# Patient Record
Sex: Female | Born: 1965 | Race: White | Hispanic: No | Marital: Married | Smoking: Never smoker
Health system: Southern US, Community
[De-identification: ages and names within clinical notes are randomized; demographics above are authoritative.]

## PROBLEM LIST (undated history)

## (undated) DIAGNOSIS — Z8489 Family history of other specified conditions: Secondary | ICD-10-CM

## (undated) DIAGNOSIS — K219 Gastro-esophageal reflux disease without esophagitis: Secondary | ICD-10-CM

## (undated) HISTORY — PX: FOOT SURGERY: SHX648

---

## 1997-12-21 ENCOUNTER — Other Ambulatory Visit: Admission: RE | Admit: 1997-12-21 | Discharge: 1997-12-21 | Payer: Self-pay | Admitting: Gynecology

## 1999-10-24 ENCOUNTER — Encounter: Admission: RE | Admit: 1999-10-24 | Discharge: 1999-10-24 | Payer: Self-pay | Admitting: Gynecology

## 1999-10-24 ENCOUNTER — Encounter: Payer: Self-pay | Admitting: Gynecology

## 2000-12-17 ENCOUNTER — Other Ambulatory Visit: Admission: RE | Admit: 2000-12-17 | Discharge: 2000-12-17 | Payer: Self-pay | Admitting: Gynecology

## 2003-05-17 ENCOUNTER — Other Ambulatory Visit: Admission: RE | Admit: 2003-05-17 | Discharge: 2003-05-17 | Payer: Self-pay | Admitting: Gynecology

## 2004-06-14 ENCOUNTER — Other Ambulatory Visit: Admission: RE | Admit: 2004-06-14 | Discharge: 2004-06-14 | Payer: Self-pay | Admitting: Gynecology

## 2004-07-05 ENCOUNTER — Encounter: Admission: RE | Admit: 2004-07-05 | Discharge: 2004-07-05 | Payer: Self-pay | Admitting: Gynecology

## 2005-07-29 ENCOUNTER — Encounter: Admission: RE | Admit: 2005-07-29 | Discharge: 2005-07-29 | Payer: Self-pay | Admitting: Gynecology

## 2005-08-08 ENCOUNTER — Other Ambulatory Visit: Admission: RE | Admit: 2005-08-08 | Discharge: 2005-08-08 | Payer: Self-pay | Admitting: Gynecology

## 2006-09-15 ENCOUNTER — Encounter: Admission: RE | Admit: 2006-09-15 | Discharge: 2006-09-15 | Payer: Self-pay | Admitting: Gynecology

## 2006-10-15 ENCOUNTER — Other Ambulatory Visit: Admission: RE | Admit: 2006-10-15 | Discharge: 2006-10-15 | Payer: Self-pay | Admitting: Gynecology

## 2007-09-11 ENCOUNTER — Ambulatory Visit: Payer: Self-pay | Admitting: Internal Medicine

## 2007-12-23 ENCOUNTER — Encounter: Admission: RE | Admit: 2007-12-23 | Discharge: 2007-12-23 | Payer: Self-pay | Admitting: Gynecology

## 2008-01-11 ENCOUNTER — Other Ambulatory Visit: Admission: RE | Admit: 2008-01-11 | Discharge: 2008-01-11 | Payer: Self-pay | Admitting: Gynecology

## 2009-01-16 ENCOUNTER — Encounter: Admission: RE | Admit: 2009-01-16 | Discharge: 2009-01-16 | Payer: Self-pay | Admitting: Gynecology

## 2009-01-19 ENCOUNTER — Encounter: Admission: RE | Admit: 2009-01-19 | Discharge: 2009-01-19 | Payer: Self-pay | Admitting: Gynecology

## 2011-08-14 ENCOUNTER — Other Ambulatory Visit: Payer: Self-pay | Admitting: Gynecology

## 2011-08-14 DIAGNOSIS — R928 Other abnormal and inconclusive findings on diagnostic imaging of breast: Secondary | ICD-10-CM

## 2011-08-22 ENCOUNTER — Ambulatory Visit
Admission: RE | Admit: 2011-08-22 | Discharge: 2011-08-22 | Disposition: A | Discharge status: Home / Self Care | Payer: BC Managed Care – PPO | Source: Ambulatory Visit | Attending: Gynecology | Admitting: Gynecology

## 2011-08-22 DIAGNOSIS — R928 Other abnormal and inconclusive findings on diagnostic imaging of breast: Secondary | ICD-10-CM

## 2013-03-09 ENCOUNTER — Ambulatory Visit: Payer: Self-pay | Admitting: Family Medicine

## 2013-03-16 ENCOUNTER — Ambulatory Visit: Payer: Self-pay | Admitting: Internal Medicine

## 2013-08-11 ENCOUNTER — Encounter: Payer: Self-pay | Admitting: Podiatry

## 2013-08-11 ENCOUNTER — Ambulatory Visit (INDEPENDENT_AMBULATORY_CARE_PROVIDER_SITE_OTHER): Payer: BC Managed Care – PPO | Admitting: Podiatry

## 2013-08-11 ENCOUNTER — Ambulatory Visit (INDEPENDENT_AMBULATORY_CARE_PROVIDER_SITE_OTHER): Payer: BC Managed Care – PPO

## 2013-08-11 VITALS — BP 116/72 | HR 59 | Resp 16 | Ht 64.0 in | Wt 112.0 lb

## 2013-08-11 DIAGNOSIS — M79672 Pain in left foot: Secondary | ICD-10-CM

## 2013-08-11 DIAGNOSIS — M79671 Pain in right foot: Secondary | ICD-10-CM

## 2013-08-11 DIAGNOSIS — M79609 Pain in unspecified limb: Secondary | ICD-10-CM

## 2013-08-11 DIAGNOSIS — M722 Plantar fascial fibromatosis: Secondary | ICD-10-CM

## 2013-08-11 MED ORDER — MELOXICAM 15 MG PO TABS: 7.5000 mg | ORAL_TABLET | Freq: Every day | ORAL | Status: DC

## 2013-08-11 MED ORDER — METHYLPREDNISOLONE (PAK) 4 MG PO TABS: ORAL_TABLET | ORAL | Status: DC

## 2013-08-11 NOTE — Progress Notes (Signed)
   Subjective:    Patient ID: Karen Kelly, female    DOB: 09-17-1965, 48 y.o.   MRN: [id number]  HPI Comments: i have terrible pain in my right arch.  i dont feel it when i wear my heels , but in flat shoes or at night when i am barefoot it is sore. It started early December . Tried using arch supports and a band that goes around the arch and advil on occasion   Foot Pain      Review of Systems  All other systems reviewed and are negative.       Objective:   Physical Exam: I have reviewed her past mental history medications allergies surgeries social history. Pulses are +2/4 DPPT bilateral. Capillary fill time to digits one through 5 bilateral is immediate. Neurologic sensorium is intact per since once the monofilament bilateral. Deep tendon reflexes are brisk and equal bilateral. Muscle strength + over 5 dorsiflexors plantar flexors inverters everters all intrinsic musculature is intact. Orthopedic evaluation demonstrates all joints distal to the ankle a full range of motion without crepitus. She does have marked increase in the first intermetatarsal angle with painful hypertrophic medial condyle to the head of the first metatarsal also visible on radiograph is marked hallux abductovalgus deformity bilateral. She also has plantar distally oriented calcaneal heel spurs on radiograph consistent with plantar fasciitis as well as a soft tissue increase in density at the plantar fascial calcaneal insertion site. She also has pain on palpation to the point of maximal tenderness at the plantar fascial calcaneal insertion site of the right foot. Cutaneous evaluation demonstrates no erythema edema saline is drainage or odor.        Assessment & Plan:  Assessment: Plantar fasciitis right foot. 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  Plan: Discussed etiology pathology conservative versus surgical therapies. Injected the right heel today with Kenalog and local anesthetic dispensed  a plantar fascial brace and a night splint. Prescribed a Medrol Dosepak to be followed by [facility]. We also discussed appropriate shoe gear stretching exercises ice therapy shoe gear modifications. And I will followup with her in one month.  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

## 2013-09-08 ENCOUNTER — Ambulatory Visit: Payer: BC Managed Care – PPO | Admitting: Podiatry

## 2016-03-28 ENCOUNTER — Encounter: Payer: Self-pay | Admitting: Family Medicine

## 2016-03-28 ENCOUNTER — Ambulatory Visit (INDEPENDENT_AMBULATORY_CARE_PROVIDER_SITE_OTHER): Payer: BC Managed Care – PPO | Admitting: Family Medicine

## 2016-03-28 VITALS — BP 118/76 | HR 62 | Ht 64.0 in | Wt 113.0 lb

## 2016-03-28 DIAGNOSIS — S0501XA Injury of conjunctiva and corneal abrasion without foreign body, right eye, initial encounter: Secondary | ICD-10-CM

## 2016-03-28 DIAGNOSIS — H5711 Ocular pain, right eye: Secondary | ICD-10-CM

## 2016-03-28 MED ORDER — TOBRAMYCIN-DEXAMETHASONE 0.3-0.1 % OP OINT: 1.0000 "application " | TOPICAL_OINTMENT | Freq: Three times a day (TID) | OPHTHALMIC | 0 refills | Status: DC

## 2016-03-28 NOTE — Progress Notes (Signed)
Name: Karen Kelly   MRN: [id number]    DOB: [birth year]   Date:03/28/2016       Progress Note  Subjective  Chief Complaint  Chief Complaint  Patient presents with  . Eye Pain    Right eye pain woke her up in her sleep. Feels like something is in eye. Pain comes in "waves".    Eye Pain   The right eye is affected. This is a new problem. The current episode started today. The problem occurs daily. The problem has been waxing and waning. There was no injury mechanism (? FB). The pain is at a severity of 5/10. The pain is moderate. There is known exposure to pink eye. Associated symptoms include eye redness and a foreign body sensation. Pertinent negatives include no blurred vision, eye discharge, double vision, fever, itching, nausea, photophobia, recent URI, tingling or vomiting. Treatments tried: lid lifted. The treatment provided mild relief.    No problem-specific Assessment & Plan notes found for this encounter.   History reviewed. No pertinent past medical history.  History reviewed. No pertinent surgical history.  History reviewed. No pertinent family history.  Social History   Social History  . Marital status: Married    Spouse name: N/A  . Number of children: N/A  . Years of education: N/A   Occupational History  . Not on file.   Social History Main Topics  . Smoking status: Never Smoker  . Smokeless tobacco: Never Used  . Alcohol use No  . Drug use: No  . Sexual activity: Not on file   Other Topics Concern  . Not on file   Social History Narrative  . No narrative on file    No Known Allergies   Review of Systems  Constitutional: Negative for chills, fever, malaise/fatigue and weight loss.  HENT: Negative for ear discharge, ear pain and sore throat.   Eyes: Positive for pain and redness. Negative for blurred vision, double vision, photophobia, discharge and itching.  Respiratory: Negative for cough, sputum production, shortness of breath and wheezing.    Cardiovascular: Negative for chest pain, palpitations and leg swelling.  Gastrointestinal: Negative for abdominal pain, blood in stool, constipation, diarrhea, heartburn, melena, nausea and vomiting.  Genitourinary: Negative for dysuria, frequency, hematuria and urgency.  Musculoskeletal: Negative for back pain, joint pain, myalgias and neck pain.  Skin: Negative for rash.  Neurological: Negative for dizziness, tingling, sensory change, focal weakness and headaches.  Endo/Heme/Allergies: Negative for environmental allergies and polydipsia. Does not bruise/bleed easily.  Psychiatric/Behavioral: Negative for depression and suicidal ideas. The patient is not nervous/anxious and does not have insomnia.   All other systems reviewed and are negative.    Objective  Vitals:   03/28/16 1427  BP: 118/76  Pulse: 62  Weight: 113 lb (51.3 kg)  Height: 5\' 4"  (1.626 m)    Physical Exam  Constitutional: She is well-developed, well-nourished, and in no distress. No distress.  HENT:  Head: Normocephalic and atraumatic.  Right Ear: External ear normal.  Left Ear: External ear normal.  Nose: Nose normal.  Mouth/Throat: Oropharynx is clear and moist.  Eyes: Conjunctivae and EOM are normal. Pupils are equal, round, and reactive to light. Right eye exhibits no discharge. Left eye exhibits no discharge.  Neck: Normal range of motion. Neck supple. No JVD present. No thyromegaly present.  Cardiovascular: Normal rate, regular rhythm, normal heart sounds and intact distal pulses.  Exam reveals no gallop and no friction rub.   No murmur heard. Pulmonary/Chest: Effort  normal and breath sounds normal.  Abdominal: Soft. Bowel sounds are normal. She exhibits no mass. There is no tenderness. There is no guarding.  Musculoskeletal: Normal range of motion. She exhibits no edema.  Lymphadenopathy:    She has no cervical adenopathy.  Neurological: She is alert. She has normal reflexes.  Skin: Skin is warm and  dry. She is not diaphoretic.  Psychiatric: Mood and affect normal.  Nursing note and vitals reviewed.     Assessment & Plan  Problem List Items Addressed This Visit    None    Visit Diagnoses    Abrasion of right cornea, initial encounter    -  Primary   referral to ophthalmology        Dr. Otilio Miu [facility]  03/28/16

## 2018-06-26 DIAGNOSIS — Z9189 Other specified personal risk factors, not elsewhere classified: Secondary | ICD-10-CM | POA: Insufficient documentation

## 2018-08-26 ENCOUNTER — Other Ambulatory Visit: Payer: Self-pay | Admitting: Obstetrics and Gynecology

## 2018-08-26 DIAGNOSIS — Z9189 Other specified personal risk factors, not elsewhere classified: Secondary | ICD-10-CM

## 2018-09-17 ENCOUNTER — Other Ambulatory Visit: Payer: BC Managed Care – PPO

## 2018-10-05 ENCOUNTER — Other Ambulatory Visit: Payer: BC Managed Care – PPO

## 2018-11-04 ENCOUNTER — Other Ambulatory Visit: Payer: BC Managed Care – PPO

## 2019-03-08 ENCOUNTER — Ambulatory Visit: Payer: BC Managed Care – PPO | Admitting: Podiatry

## 2019-03-17 ENCOUNTER — Ambulatory Visit: Payer: BC Managed Care – PPO | Admitting: Podiatry

## 2019-03-22 ENCOUNTER — Encounter: Payer: Self-pay | Admitting: Podiatry

## 2019-03-22 ENCOUNTER — Ambulatory Visit (INDEPENDENT_AMBULATORY_CARE_PROVIDER_SITE_OTHER): Payer: BC Managed Care – PPO

## 2019-03-22 ENCOUNTER — Ambulatory Visit: Payer: BC Managed Care – PPO | Admitting: Podiatry

## 2019-03-22 ENCOUNTER — Other Ambulatory Visit: Payer: Self-pay

## 2019-03-22 DIAGNOSIS — M2012 Hallux valgus (acquired), left foot: Secondary | ICD-10-CM

## 2019-03-22 DIAGNOSIS — M2011 Hallux valgus (acquired), right foot: Secondary | ICD-10-CM | POA: Diagnosis not present

## 2019-03-22 NOTE — Progress Notes (Signed)
  Subjective:  Patient ID: Karen Kelly, female    DOB: [birth year],  MRN: [id number] HPI Chief Complaint  Patient presents with  . Bunions    Patient presents today for bilat bunions, right worse than left.x 3 years.  She reports her right has progressivly gotten worse over the past 4 months and her right toes 2,3 4 are going numb.  She states "it throbs at times and is worse when I run or depending on what shoes I wear."  She has tried diffrent shoes for reilef    52 y.o. female presents with the above complaint.   ROS: Denies fever chills nausea vomiting muscle aches pains calf pain back pain chest pain shortness of breath.  No past medical history on file. No past surgical history on file. No current outpatient medications on file.  No Known Allergies Review of Systems Objective:  There were no vitals filed for this visit.  General: Well developed, nourished, in no acute distress, alert and oriented x3   Dermatological: Skin is warm, dry and supple bilateral. Nails x 10 are well maintained; remaining integument appears unremarkable at this time. There are no open sores, no preulcerative lesions, no rash or signs of infection present.  Vascular: Dorsalis Pedis artery and Posterior Tibial artery pedal pulses are 2/4 bilateral with immedate capillary fill time. Pedal hair growth present. No varicosities and no lower extremity edema present bilateral.   Neruologic: Grossly intact via light touch bilateral. Vibratory intact via tuning fork bilateral. Protective threshold with Semmes Wienstein monofilament intact to all pedal sites bilateral. Patellar and Achilles deep tendon reflexes 2+ bilateral. No Babinski or clonus noted bilateral.  Palpable Mulder's click third interdigital space right.  Musculoskeletal: No gross boney pedal deformities bilateral. No pain, crepitus, or limitation noted with foot and ankle range of motion bilateral. Muscular strength 5/5 in all groups tested  bilateral.  Moderate to severe hallux abductovalgus deformity of the right foot over the left.  Elongated second metatarsal with mildly cocked up hammertoe deformity second right.  She has some flexibility of the first TMT joint.  Gait: Unassisted, Nonantalgic.    Radiographs:  Moderate to severely increased in the first intermetatarsal angle greater than normal value.  Right foot over left foot.  Mild hallux abductus angle greater than normal value right greater than left.  Early osteoarthritic changes right foot.  Plantarflexed elongated second metatarsal.  Assessment & Plan:   Assessment: Hallux valgus deformity plantarflexed metatarsal with capsulitis second neuroma third interdigital space right.  Plan: Discussed etiology pathology conservative surgical therapies at this point we discussed in great detail today surgery.  At this point we also went ahead and consented her for a Lapidus bunionectomy with fusion of the first TMT second metatarsal osteotomy shortening in nature and a injection to the third interdigital space with cortisone.  We also consented her for a below-knee cast.  We discussed the pros and cons of surgery and possible side effects which may include but not limited to postop pain bleeding swelling infection recurrence need further surgery overcorrection under correction loss of digit loss of limb loss of life.  She understood this was amenable to it we provided her with information regarding the surgery center anesthesia group and instructions for the morning of surgery.  I will follow-up with her at that time.     Karen Kelly T. [place], [place]

## 2019-03-22 NOTE — Patient Instructions (Signed)
Pre-Operative Instructions  Congratulations, you have decided to take an important step towards improving your quality of life.  You can be assured that the doctors and staff at [facility] will be with you every step of the way.  Here are some important things you should know:  1. Plan to be at the surgery center/hospital at least 1 (one) hour prior to your scheduled time, unless otherwise directed by the surgical center/hospital staff.  You must have a responsible adult accompany you, remain during the surgery and drive you home.  Make sure you have directions to the surgical center/hospital to ensure you arrive on time. 2. If you are having surgery at [facility] or [facility] hospitals, you will need a copy of your medical history and physical form from your family physician within one month prior to the date of surgery. We will give you a form for your primary physician to complete.  3. We make every effort to accommodate the date you request for surgery.  However, there are times where surgery dates or times have to be moved.  We will contact you as soon as possible if a change in schedule is required.   4. No aspirin/ibuprofen for one week before surgery.  If you are on aspirin, any non-steroidal anti-inflammatory medications (Mobic, Aleve, Ibuprofen) should not be taken seven (7) days prior to your surgery.  You make take Tylenol for pain prior to surgery.  5. Medications - If you are taking daily heart and blood pressure medications, seizure, reflux, allergy, asthma, anxiety, pain or diabetes medications, make sure you notify the surgery center/hospital before the day of surgery so they can tell you which medications you should take or avoid the day of surgery. 6. No food or drink after midnight the night before surgery unless directed otherwise by surgical center/hospital staff. 7. No alcoholic beverages 24-hours prior to surgery.  No smoking 24-hours prior or 24-hours after  surgery. 8. Wear loose pants or shorts. They should be loose enough to fit over bandages, boots, and casts. 9. Don't wear slip-on shoes. Sneakers are preferred. 10. Bring your boot with you to the surgery center/hospital.  Also bring crutches or a walker if your physician has prescribed it for you.  If you do not have this equipment, it will be provided for you after surgery. 11. If you have not been contacted by the surgery center/hospital by the day before your surgery, call to confirm the date and time of your surgery. 12. Leave-time from work may vary depending on the type of surgery you have.  Appropriate arrangements should be made prior to surgery with your employer. 13. Prescriptions will be provided immediately following surgery by your doctor.  Fill these as soon as possible after surgery and take the medication as directed. Pain medications will not be refilled on weekends and must be approved by the doctor. 14. Remove nail polish on the operative foot and avoid getting pedicures prior to surgery. 15. Wash the night before surgery.  The night before surgery wash the foot and leg well with water and the antibacterial soap provided. Be sure to pay special attention to beneath the toenails and in between the toes.  Wash for at least three (3) minutes. Rinse thoroughly with water and dry well with a towel.  Perform this wash unless told not to do so by your physician.  Enclosed: 1 Ice pack (please put in freezer the night before surgery)   1 Hibiclens skin cleaner     Pre-op instructions  If you have any questions regarding the instructions, please do not hesitate to call our office.  [place]: [address], [place], [place] [address] -- [phone]  [place]: [address] [place], [place] [address] -- [phone]  [place]: [address]  [place], [place] [address] -- [phone]   Website: [email or web address] 

## 2019-03-24 ENCOUNTER — Telehealth: Payer: Self-pay | Admitting: *Deleted

## 2019-03-24 NOTE — Telephone Encounter (Signed)
"  I am calling to schedule a surgical procedure with Dr. Milinda Pointer.  I'm from the [place] office.  If you could, give me a call back.  I was hoping to schedule for the first Friday in December, if at all possible.  You can certainly let me know what the availability is and we can go from there."

## 2019-03-24 NOTE — Telephone Encounter (Signed)
Left voicemail to get her surgery scheduled. Told her I could schedule her on Friday 05/21/2019 for her surgery as he was booked the first Friday in December. Asked her to call me back to let me know if 05/21/2019 worked for her.

## 2019-03-26 NOTE — Telephone Encounter (Signed)
I was calling back regarding scheduling surgery with Dr. Milinda Pointer for 05/21/2019. If I need additional information at this juncture you can reach me at [phone]. If not, I will have this on my schedule as well. Thank you so much.

## 2019-04-05 ENCOUNTER — Telehealth: Payer: Self-pay | Admitting: Podiatry

## 2019-04-05 NOTE — Telephone Encounter (Signed)
Left message for pt letting her know that I was changing her surgery date from 05/21/19 to 04/30/19 as Dr. Milinda Pointer had availability. I told her that as far as time goes I could not give her a time that it depended on if the surgical center had any changes to their surgery schedules. I explained they always call a day or two prior to let you know what time to arrive that it depends if they have any diabetic or pediatric patients that need to go back first. I stated to the pt to let me know if her MyChart password had expired and that if it had, I could send her a new password. Stated she would be able to communicate with [place] that way, not just me but the doctor or nurse with any questions, see when her appointments are scheduled etc. Told pt to call me back with any other questions.   I contacted Caren Griffins to change the surgery date on her end. I also rescheduled the surgery on Dr. Stephenie Acres schedule in [place] as well as in the surgical book.

## 2019-04-05 NOTE — Telephone Encounter (Signed)
I'm scheduled for surgery on 05/21/19 and wanted to make sure you got my message and have me scheduled for that day. With the changes to the school schedule, I was wondering if anything was available for 04/30/19 and if so, I would like to reschedule to then. If not, I'll keep my surgery scheduled for 05/21/19. I was also hoping to get a little more information about the time and just more details. Please give me a call back. Thank you.

## 2019-04-05 NOTE — Telephone Encounter (Signed)
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

## 2019-04-06 ENCOUNTER — Telehealth: Payer: Self-pay | Admitting: Podiatry

## 2019-04-06 NOTE — Telephone Encounter (Signed)
I don't have a MyChart account so I will need a code. Is MyChart for [facility] or [facility]?

## 2019-04-06 NOTE — Telephone Encounter (Signed)
Called pt and sent her a text via her cell phone with the code to get set up with [facility] [place]. Told pt she would have to register and she could set up her own user name and reset her password.

## 2019-04-28 ENCOUNTER — Encounter: Payer: Self-pay | Admitting: Podiatry

## 2019-04-30 ENCOUNTER — Encounter: Payer: Self-pay | Admitting: Podiatry

## 2019-04-30 ENCOUNTER — Other Ambulatory Visit: Payer: Self-pay | Admitting: Podiatry

## 2019-04-30 DIAGNOSIS — G5751 Tarsal tunnel syndrome, right lower limb: Secondary | ICD-10-CM | POA: Diagnosis not present

## 2019-04-30 DIAGNOSIS — M21541 Acquired clubfoot, right foot: Secondary | ICD-10-CM

## 2019-04-30 DIAGNOSIS — M2011 Hallux valgus (acquired), right foot: Secondary | ICD-10-CM

## 2019-04-30 MED ORDER — OXYCODONE-ACETAMINOPHEN 10-325 MG PO TABS: 1.0000 | ORAL_TABLET | ORAL | 0 refills | Status: DC | PRN

## 2019-04-30 NOTE — Progress Notes (Signed)
Rx for Percocet sent to pharmacy.

## 2019-05-03 ENCOUNTER — Encounter: Payer: BC Managed Care – PPO | Admitting: Podiatry

## 2019-05-10 ENCOUNTER — Ambulatory Visit (INDEPENDENT_AMBULATORY_CARE_PROVIDER_SITE_OTHER): Payer: BC Managed Care – PPO

## 2019-05-10 ENCOUNTER — Ambulatory Visit (INDEPENDENT_AMBULATORY_CARE_PROVIDER_SITE_OTHER): Payer: BC Managed Care – PPO | Admitting: Podiatry

## 2019-05-10 ENCOUNTER — Other Ambulatory Visit: Payer: Self-pay

## 2019-05-10 ENCOUNTER — Encounter: Payer: Self-pay | Admitting: Podiatry

## 2019-05-10 VITALS — BP 124/76 | HR 57 | Temp 99.0°F

## 2019-05-10 DIAGNOSIS — Z9889 Other specified postprocedural states: Secondary | ICD-10-CM

## 2019-05-10 DIAGNOSIS — M2011 Hallux valgus (acquired), right foot: Secondary | ICD-10-CM | POA: Diagnosis not present

## 2019-05-11 ENCOUNTER — Encounter: Payer: Self-pay | Admitting: Podiatry

## 2019-05-11 NOTE — Progress Notes (Signed)
  Subjective:  Patient ID: Karen Kelly, female    DOB: 05/14/1966,  MRN: [id number]  Chief Complaint  Patient presents with  . Routine Post Op     pov#1 dos 11.20.2020 Lapidus Procedure Including Bunionectomy Rt, Metatarsal Osteotomy 2nd Rt, Injection Neuroma 3rd Interspace Rt, Cast Application Rt -      53 y.o. female returns for post-op check.  Patient states that she is doing well.  She has mild pain to the right foot.  She denies any other acute problems.  She states she has been nonweightbearing to the right lower extremity with a cam boot.  She states the cast has been fine without any aggravation.  She denies any other acute complaints.  Review of Systems: Negative except as noted in the HPI. Denies N/V/F/Ch.  No past medical history on file.  Current Outpatient Medications:  .  oxyCODONE-acetaminophen (PERCOCET) 10-325 MG tablet, Take 1 tablet by mouth every 4 (four) hours as needed for pain., Disp: 20 tablet, Rfl: 0  Social History   Tobacco Use  Smoking Status Never Smoker  Smokeless Tobacco Never Used    No Known Allergies Objective:   Vitals:   05/10/19 1546  BP: 124/76  Pulse: (!) 57  Temp: 99 F (37.2 C)   There is no height or weight on file to calculate BMI. Constitutional Well developed. Well nourished.  Vascular Foot warm and well perfused. Capillary refill normal to all digits.   Neurologic Normal speech. Oriented to person, place, and time. Epicritic sensation to light touch grossly present bilaterally.  Dermatologic Skin healing well without signs of infection. Skin edges well coapted without signs of infection.  Orthopedic: Tenderness to palpation noted about the surgical site.   Radiographs: 3 views of skeletally mature adult foot:Hardware is intact without any signs of loosening noted.  Good correction of the bunion deformity and the second metatarsal Weil osteotomy noted. Assessment:   1. Hav (hallux abducto valgus), right   2. S/P foot  surgery, right    Plan:  Patient was evaluated and treated and all questions answered.  S/p foot surgery right -Progressing as expected post-operatively. -XR: See above -WB Status: Nonweightbearing in cam boot with crutches -Sutures: Unable to evaluate as the cast will be kept intact for this visit.  We plan on removing the cast during next visit to evaluate the incision. -Medications: None -Foot redressed.  No follow-ups on file.

## 2019-05-12 ENCOUNTER — Encounter: Payer: BC Managed Care – PPO | Admitting: Podiatry

## 2019-05-19 ENCOUNTER — Other Ambulatory Visit: Payer: Self-pay

## 2019-05-19 ENCOUNTER — Ambulatory Visit (INDEPENDENT_AMBULATORY_CARE_PROVIDER_SITE_OTHER): Payer: BC Managed Care – PPO | Admitting: Podiatry

## 2019-05-19 DIAGNOSIS — Z9889 Other specified postprocedural states: Secondary | ICD-10-CM

## 2019-05-19 DIAGNOSIS — M2011 Hallux valgus (acquired), right foot: Secondary | ICD-10-CM | POA: Diagnosis not present

## 2019-05-19 NOTE — Progress Notes (Signed)
She presents today for her second postop visit.  She denies fever chills nausea vomiting muscle aches pains calf pain back pain chest pain shortness of breath.  She states that the cast has been doing well and she has not been walking on it.  She states that she has been scared to move her toes.  Objective: Vital signs are stable she is alert and oriented x3 cast is in tract tacked and dry and clean.  Was removed demonstrates dressing is intact dry and clean.  Was removed demonstrates sutures are intact margins are well coapted no erythema no edema cellulitis drainage or odor good range of motion on passive range of motion first metatarsophalangeal joint and second metatarsal phalangeal joint.  Assessment: Well-healing surgical foot.  Plan: Redressed today applied another below-knee cast right lower extremity follow-up with her in 2 weeks for cast removal and boot application.

## 2019-05-26 ENCOUNTER — Encounter: Payer: BC Managed Care – PPO | Admitting: Podiatry

## 2019-06-02 ENCOUNTER — Ambulatory Visit (INDEPENDENT_AMBULATORY_CARE_PROVIDER_SITE_OTHER): Payer: BC Managed Care – PPO | Admitting: Podiatry

## 2019-06-02 ENCOUNTER — Ambulatory Visit (INDEPENDENT_AMBULATORY_CARE_PROVIDER_SITE_OTHER): Payer: BC Managed Care – PPO

## 2019-06-02 ENCOUNTER — Other Ambulatory Visit: Payer: Self-pay

## 2019-06-02 DIAGNOSIS — Z9889 Other specified postprocedural states: Secondary | ICD-10-CM

## 2019-06-02 DIAGNOSIS — M2011 Hallux valgus (acquired), right foot: Secondary | ICD-10-CM | POA: Diagnosis not present

## 2019-06-02 NOTE — Progress Notes (Signed)
She presents today for cast removal date of surgery May 08, 2019 status post Lapidus procedure a second metatarsal osteotomy right foot.  She denies fever chills nausea vomiting muscle aches and pains continue to use her crutches on a regular basis.  Objective: Vital signs stable she is alert and oriented x3.  Cast is intact was removed demonstrates dressed her dressing intact was removed demonstrates sutures are intact margins well coapted sutures were removed today margins remain well coapted she has good range of motion of the first metatarsophalangeal joint skin is dry and stiff but pulses are palpable.  There is no edema no erythema cellulitis drainage or odor.  Radiographs demonstrate internal fixation is in good position with osseous cross bridging and induction taken place.  Assessment: Well-healing surgical foot.  Plan: Put her in a compression anklet for any potential swelling and also put her in a cam walker.  I will let her stand static but if she is going to move about she will continue to use nonweightbearing status with her crutches and/or knee scooter.  Follow-up with her in 2 weeks x-rays will be taken at that time

## 2019-06-09 ENCOUNTER — Encounter: Payer: BC Managed Care – PPO | Admitting: Podiatry

## 2019-06-16 ENCOUNTER — Other Ambulatory Visit: Payer: Self-pay

## 2019-06-16 ENCOUNTER — Ambulatory Visit (INDEPENDENT_AMBULATORY_CARE_PROVIDER_SITE_OTHER): Payer: BC Managed Care – PPO | Admitting: Podiatry

## 2019-06-16 ENCOUNTER — Ambulatory Visit (INDEPENDENT_AMBULATORY_CARE_PROVIDER_SITE_OTHER): Payer: BC Managed Care – PPO

## 2019-06-16 ENCOUNTER — Encounter: Payer: Self-pay | Admitting: Podiatry

## 2019-06-16 DIAGNOSIS — M2011 Hallux valgus (acquired), right foot: Secondary | ICD-10-CM | POA: Diagnosis not present

## 2019-06-16 DIAGNOSIS — Z9889 Other specified postprocedural states: Secondary | ICD-10-CM

## 2019-06-16 NOTE — Progress Notes (Signed)
She presents today date of surgery 04/30/2019 Lapidus procedure including bunionectomy second metatarsal osteotomy right.  She states that she is back to work she has some swelling but all in all she is doing very well.  Objective: Vital signs are stable she is alert and oriented x3.  Pulses are palpable.  She has some swelling about the forefoot dorsal and plantar.  Radiographs taken today demonstrate well-healing surgical foot well-placed internal fixation still in good position.  Assessment: Well-healing surgical foot l right.  Plan: I will allow her to start partial weightbearing with the boot in a motor dispensed a Darco shoe she will convert to the shoe in the next few days and then by next time she will be in a tennis shoe at office visit.

## 2019-07-07 ENCOUNTER — Other Ambulatory Visit: Payer: Self-pay

## 2019-07-07 ENCOUNTER — Ambulatory Visit (INDEPENDENT_AMBULATORY_CARE_PROVIDER_SITE_OTHER): Payer: BC Managed Care – PPO

## 2019-07-07 ENCOUNTER — Ambulatory Visit (INDEPENDENT_AMBULATORY_CARE_PROVIDER_SITE_OTHER): Payer: BC Managed Care – PPO | Admitting: Podiatry

## 2019-07-07 DIAGNOSIS — Z9889 Other specified postprocedural states: Secondary | ICD-10-CM

## 2019-07-07 DIAGNOSIS — M2011 Hallux valgus (acquired), right foot: Secondary | ICD-10-CM

## 2019-07-07 NOTE — Progress Notes (Signed)
She presents today for follow-up of her Lapidus procedure date of surgery April 30, 2019 with metatarsal osteotomy second right.  States that the pain is better but it still swells a lot and gets some tingling.  Objective: Vital signs are stable alert and oriented x3.  She has limited range of motion the first metatarsophalangeal joint of the right foot which was full at the time of surgery.  Radiographs taken today demonstrate well healing fusion site.  No signs of infection and no loosening of internal fixation.  Assessment: Well-healing surgical foot.  Plan: Encourage range of motion exercises and encouraged ambulation.  We will send her to physical therapy to increase mobility.

## 2019-08-04 ENCOUNTER — Encounter: Payer: Self-pay | Admitting: Podiatry

## 2019-08-04 ENCOUNTER — Ambulatory Visit (INDEPENDENT_AMBULATORY_CARE_PROVIDER_SITE_OTHER): Payer: BC Managed Care – PPO | Admitting: Podiatry

## 2019-08-04 ENCOUNTER — Ambulatory Visit (INDEPENDENT_AMBULATORY_CARE_PROVIDER_SITE_OTHER): Payer: BC Managed Care – PPO

## 2019-08-04 ENCOUNTER — Other Ambulatory Visit: Payer: Self-pay

## 2019-08-04 DIAGNOSIS — M2011 Hallux valgus (acquired), right foot: Secondary | ICD-10-CM

## 2019-08-04 DIAGNOSIS — Z9889 Other specified postprocedural states: Secondary | ICD-10-CM

## 2019-08-04 NOTE — Progress Notes (Signed)
She presents today date of surgery April 30, 2019 status post Lapidus procedure and a second metatarsal osteotomy right foot.  She states that she is doing a lot better now with physical therapy and she is able to walk on the foot in a shoe in the more of a heel-to-toe fashion.  Still has tenderness beneath the second metatarsophalangeal joint.  Objective: Vital signs stable she is alert and oriented x3.  Mild elevation of the first metatarsal is noted.  However I think part of that is favoring her forefoot.  This is demonstrated primarily on lateral radiograph.  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  I do think that stiffness will probably go ahead and resolve once she has full range of motion back.  Assessment: Well-healing surgical foot.  Plan: I encouraged her to be more aggressive with her range of motion and activities I will follow-up with her in about a month she should be finishing up physical therapy by then we will reevaluate the second metatarsal and do another set of x-rays with her full body weight on that foot.

## 2019-09-01 ENCOUNTER — Ambulatory Visit: Payer: BC Managed Care – PPO | Admitting: Podiatry

## 2019-09-01 ENCOUNTER — Other Ambulatory Visit: Payer: Self-pay

## 2019-09-01 ENCOUNTER — Ambulatory Visit (INDEPENDENT_AMBULATORY_CARE_PROVIDER_SITE_OTHER): Payer: BC Managed Care – PPO

## 2019-09-01 ENCOUNTER — Encounter: Payer: Self-pay | Admitting: Podiatry

## 2019-09-01 DIAGNOSIS — Z9889 Other specified postprocedural states: Secondary | ICD-10-CM

## 2019-09-01 DIAGNOSIS — M2011 Hallux valgus (acquired), right foot: Secondary | ICD-10-CM

## 2019-09-01 NOTE — Progress Notes (Signed)
She presents today date of surgery April 30, 2019 Lapidus procedure including bunionectomy with second metatarsal osteotomy and excision neuroma third interdigital space states that is feeling pretty good is doing really well she is very happy with the outcome thus far she states that she still has a little bit of swelling that physical therapy seems to be helping out with considerably.  Objective: Vital signs are stable she is alert and oriented x3.  Pulses are palpable.  She has some edema in the distalmost aspect of the foot at the level of the second metatarsophalangeal joint otherwise it is landmarks are coming back normally vessels and tendons are starting to reappear she has good range of motion of the second toe though we do need to plantarflex the second proximal phalanx at the level of the joint a little more but I think that that will calm with physical therapy and reduction in the swelling.  Radiographs taken today demonstrate well healing arthrodesis at the first TMT J.  Assessment: Well-healing surgical foot.  Plan: Continue physical therapy follow-up with me when she has completed physical therapy.

## 2019-12-06 ENCOUNTER — Other Ambulatory Visit: Payer: Self-pay

## 2019-12-06 ENCOUNTER — Ambulatory Visit: Payer: BC Managed Care – PPO | Admitting: Family Medicine

## 2019-12-06 ENCOUNTER — Encounter: Payer: Self-pay | Admitting: Family Medicine

## 2019-12-06 VITALS — BP 120/72 | HR 76 | Ht 64.0 in | Wt 105.0 lb

## 2019-12-06 DIAGNOSIS — L247 Irritant contact dermatitis due to plants, except food: Secondary | ICD-10-CM

## 2019-12-06 DIAGNOSIS — Z1211 Encounter for screening for malignant neoplasm of colon: Secondary | ICD-10-CM

## 2019-12-06 DIAGNOSIS — W57XXXA Bitten or stung by nonvenomous insect and other nonvenomous arthropods, initial encounter: Secondary | ICD-10-CM | POA: Diagnosis not present

## 2019-12-06 MED ORDER — TRIAMCINOLONE ACETONIDE 0.1 % EX CREA: 1.0000 "application " | TOPICAL_CREAM | Freq: Two times a day (BID) | CUTANEOUS | 1 refills | Status: DC

## 2019-12-06 MED ORDER — PREDNISONE 10 MG PO TABS: 10.0000 mg | ORAL_TABLET | Freq: Every day | ORAL | 0 refills | Status: DC

## 2019-12-06 NOTE — Progress Notes (Signed)
Date:  12/06/2019   Name:  Karen Kelly   DOB:  1966-03-16   MRN:  [id number]   Chief Complaint: Rash (rash on arms and has spread over body. Itching- was using chainsaw trimming trees.) and Colon Cancer Screening (colonoscopy)  Rash This is a new problem. The current episode started in the past 7 days (Saturday). The problem has been gradually worsening since onset. The rash is diffuse. The rash is characterized by redness and itchiness. She was exposed to an insect bite/sting. Pertinent negatives include no anorexia, congestion, cough, diarrhea, eye pain, facial edema, fatigue, fever, joint pain, nail changes, rhinorrhea, shortness of breath, sore throat or vomiting. Treatments tried: calamine/cortizone. The treatment provided no relief.    No results found for: CREATININE, BUN, NA, K, CL, CO2 No results found for: CHOL, HDL, LDLCALC, LDLDIRECT, TRIG, CHOLHDL No results found for: TSH No results found for: HGBA1C No results found for: WBC, HGB, HCT, MCV, PLT No results found for: ALT, AST, GGT, ALKPHOS, BILITOT   Review of Systems  Constitutional: Negative.  Negative for chills, fatigue, fever and unexpected weight change.  HENT: Negative for congestion, ear discharge, ear pain, rhinorrhea, sinus pressure, sneezing and sore throat.   Eyes: Negative for photophobia, pain, discharge, redness and itching.  Respiratory: Negative for cough, shortness of breath, wheezing and stridor.   Cardiovascular: Negative for chest pain, palpitations and leg swelling.  Gastrointestinal: Negative for abdominal pain, anorexia, blood in stool, constipation, diarrhea, nausea and vomiting.  Endocrine: Negative for cold intolerance, heat intolerance, polydipsia, polyphagia and polyuria.  Genitourinary: Negative for dysuria, flank pain, frequency, hematuria, menstrual problem, pelvic pain, urgency, vaginal bleeding and vaginal discharge.  Musculoskeletal: Negative for arthralgias, back pain, joint pain and  myalgias.  Skin: Positive for rash. Negative for nail changes.  Allergic/Immunologic: Negative for environmental allergies and food allergies.  Neurological: Negative for dizziness, weakness, light-headedness, numbness and headaches.  Hematological: Negative for adenopathy. Does not bruise/bleed easily.  Psychiatric/Behavioral: Negative for dysphoric mood. The patient is not nervous/anxious.     Patient Active Problem List   Diagnosis Date Noted  . At high risk for breast cancer 06/26/2018    No Known Allergies  No past surgical history on file.  Social History   Tobacco Use  . Smoking status: Never Smoker  . Smokeless tobacco: Never Used  Substance Use Topics  . Alcohol use: No  . Drug use: No     Medication list has been reviewed and updated.  No outpatient medications have been marked as taking for the 12/06/19 encounter (Office Visit) with Juline Patch, MD.    [facility] 2/9 Scores 12/06/2019  PHQ - 2 Score 0  PHQ- 9 Score 0    GAD 7 : Generalized Anxiety Score 12/06/2019  Nervous, Anxious, on Edge 0  Control/stop worrying 0  Worry too much - different things 0  Trouble relaxing 0  Restless 0  Easily annoyed or irritable 0  Afraid - awful might happen 0  Total GAD 7 Score 0    BP Readings from Last 3 Encounters:  12/06/19 120/72  05/10/19 124/76  03/28/16 118/76    Physical Exam Vitals and nursing note reviewed.  Constitutional:      General: She is not in acute distress.    Appearance: She is not diaphoretic.  HENT:     Head: Normocephalic and atraumatic.     Right Ear: Tympanic membrane, ear canal and external ear normal.     Left Ear: Tympanic  membrane, ear canal and external ear normal.     Nose: Nose normal. No congestion or rhinorrhea.  Eyes:     General:        Right eye: No discharge.        Left eye: No discharge.     Conjunctiva/sclera: Conjunctivae normal.     Pupils: Pupils are equal, round, and reactive to light.  Neck:     Thyroid: No  thyromegaly.     Vascular: No carotid bruit or JVD.  Cardiovascular:     Rate and Rhythm: Normal rate and regular rhythm.     Heart sounds: Normal heart sounds. No murmur heard.  No friction rub. No gallop.   Pulmonary:     Effort: Pulmonary effort is normal.     Breath sounds: Normal breath sounds. No wheezing, rhonchi or rales.  Abdominal:     General: Bowel sounds are normal.     Palpations: Abdomen is soft. There is no mass.     Tenderness: There is no abdominal tenderness. There is no right CVA tenderness, left CVA tenderness or guarding.  Musculoskeletal:        General: Normal range of motion.     Cervical back: Normal range of motion and neck supple.  Lymphadenopathy:     Cervical: No cervical adenopathy.  Skin:    General: Skin is warm and dry.     Coloration: Skin is not jaundiced or pale.     Findings: No bruising, erythema, lesion or rash.  Neurological:     Mental Status: She is alert.     Deep Tendon Reflexes: Reflexes are normal and symmetric.     Wt Readings from Last 3 Encounters:  12/06/19 105 lb (47.6 kg)  03/28/16 113 lb (51.3 kg)  08/11/13 112 lb (50.8 kg)    BP 120/72   Pulse 76   Ht 5\' 4"  (1.626 m)   Wt 105 lb (47.6 kg)   BMI 18.02 kg/m   Assessment and Plan: 1. Contact dermatitis and eczema due to plant New onset.  Patient got a chain saw for retirement and was using it and afterwards developed a rash over her arms and legs and body.  There is no vesicular rash or Koebner phenomenon to suggest contact dermatitis from plant however it may be in early stages and we will initiate prednisone taper [id number].  We will also use some triamcinolone cream on the individual bites there is suspect to be insect developed. - predniSONE (DELTASONE) 10 MG tablet; Take 1 tablet (10 mg total) by mouth daily with breakfast. Taper 4,4,4,3,3,3,2,2,2,1,1,1  Dispense: 30 tablet; Refill: 0 - triamcinolone cream (KENALOG) 0.1 %; Apply 1 application topically 2 (two)  times daily.  Dispense: 453.6 g; Refill: 1 - Ambulatory referral to Gastroenterology  2. Insect bite, unspecified site, initial encounter As noted above - predniSONE (DELTASONE) 10 MG tablet; Take 1 tablet (10 mg total) by mouth daily with breakfast. Taper 4,4,4,3,3,3,2,2,2,1,1,1  Dispense: 30 tablet; Refill: 0 - triamcinolone cream (KENALOG) 0.1 %; Apply 1 application topically 2 (two) times daily.  Dispense: 453.6 g; Refill: 1  3. Colon cancer screening Discussed with patient and colonoscopy was scheduled with [facility]/Dr. Allen Norris.

## 2019-12-14 ENCOUNTER — Telehealth (INDEPENDENT_AMBULATORY_CARE_PROVIDER_SITE_OTHER): Payer: Self-pay | Admitting: Gastroenterology

## 2019-12-14 ENCOUNTER — Other Ambulatory Visit (INDEPENDENT_AMBULATORY_CARE_PROVIDER_SITE_OTHER): Payer: Self-pay

## 2019-12-14 DIAGNOSIS — Z1211 Encounter for screening for malignant neoplasm of colon: Secondary | ICD-10-CM

## 2019-12-14 NOTE — Progress Notes (Signed)
Gastroenterology Pre-Procedure Review  Request Date: Thursday 01/27/20 Requesting Physician: Dr. Allen Norris  PATIENT REVIEW QUESTIONS: The patient responded to the following health history questions as indicated:    1. Are you having any GI issues? no 2. Do you have a personal history of Polyps? no 3. Do you have a family history of Colon Cancer or Polyps? no 4. Diabetes Mellitus? no 5. Joint replacements in the past 12 months?no 6. Major health problems in the past 3 months?no 7. Any artificial heart valves, MVP, or defibrillator?no    MEDICATIONS & ALLERGIES:    Patient reports the following regarding taking any anticoagulation/antiplatelet therapy:   Plavix, Coumadin, Eliquis, Xarelto, Lovenox, Pradaxa, Brilinta, or Effient? no Aspirin? no  Patient confirms/reports the following medications:  Current Outpatient Medications  Medication Sig Dispense Refill  . triamcinolone cream (KENALOG) 0.1 % Apply 1 application topically 2 (two) times daily. 453.6 g 1  . predniSONE (DELTASONE) 10 MG tablet Take 1 tablet (10 mg total) by mouth daily with breakfast. Taper 4,4,4,3,3,3,2,2,2,1,1,1 (Patient not taking: Reported on 12/14/2019) 30 tablet 0   No current facility-administered medications for this visit.    Patient confirms/reports the following allergies:  No Known Allergies  No orders of the defined types were placed in this encounter.   AUTHORIZATION INFORMATION Primary Insurance: 1D#: Group #:  Secondary Insurance: 1D#: Group #:  SCHEDULE INFORMATION: Date: Thursday 01/27/20 Time: Location:[facility]

## 2019-12-29 ENCOUNTER — Ambulatory Visit: Payer: BC Managed Care – PPO | Admitting: Podiatry

## 2019-12-29 ENCOUNTER — Encounter: Payer: Self-pay | Admitting: Podiatry

## 2019-12-29 ENCOUNTER — Other Ambulatory Visit: Payer: Self-pay

## 2019-12-29 DIAGNOSIS — M2011 Hallux valgus (acquired), right foot: Secondary | ICD-10-CM | POA: Diagnosis not present

## 2019-12-29 DIAGNOSIS — M79672 Pain in left foot: Secondary | ICD-10-CM

## 2019-12-29 NOTE — Progress Notes (Signed)
She presents today states that she is doing pretty well as she refers to the second metatarsophalangeal joint.  States that it still swells.  She would like to consider orthotics.  Objective: Vital signs are stable alert oriented x3 she still has pain on the plantar aspect of the second metatarsophalangeal joint of the right foot.  At this point I think the capsulitis is her biggest problem radiographs do not demonstrate any type of osseous abnormalities in this area retention of the screws may be part of the pain that is present.  Assessment: Capsulitis second metatarsophalangeal joint of the right foot and possible painful screws.  Plan: Orthotics were made today.

## 2020-01-21 ENCOUNTER — Other Ambulatory Visit: Payer: Self-pay

## 2020-01-21 ENCOUNTER — Encounter: Payer: Self-pay | Admitting: Gastroenterology

## 2020-01-25 ENCOUNTER — Other Ambulatory Visit
Admission: RE | Admit: 2020-01-25 | Discharge: 2020-01-25 | Disposition: A | Discharge status: Home / Self Care | Payer: BC Managed Care – PPO | Source: Ambulatory Visit | Attending: Gastroenterology | Admitting: Gastroenterology

## 2020-01-25 ENCOUNTER — Other Ambulatory Visit: Payer: Self-pay

## 2020-01-25 DIAGNOSIS — Z20822 Contact with and (suspected) exposure to covid-19: Secondary | ICD-10-CM | POA: Insufficient documentation

## 2020-01-25 DIAGNOSIS — Z01812 Encounter for preprocedural laboratory examination: Secondary | ICD-10-CM | POA: Diagnosis not present

## 2020-01-25 LAB — SARS CORONAVIRUS 2 (TAT 6-24 HRS): SARS Coronavirus 2: NEGATIVE

## 2020-01-26 ENCOUNTER — Other Ambulatory Visit: Payer: Self-pay

## 2020-01-26 ENCOUNTER — Ambulatory Visit: Payer: BC Managed Care – PPO | Admitting: Orthotics

## 2020-01-26 DIAGNOSIS — M2012 Hallux valgus (acquired), left foot: Secondary | ICD-10-CM

## 2020-01-26 DIAGNOSIS — M2011 Hallux valgus (acquired), right foot: Secondary | ICD-10-CM

## 2020-01-26 NOTE — Discharge Instructions (Signed)
General Anesthesia, Adult, Care After This sheet gives you information about how to care for yourself after your procedure. Your health care provider may also give you more specific instructions. If you have problems or questions, contact your health care provider. What can I expect after the procedure? After the procedure, the following side effects are common:  Pain or discomfort at the IV site.  Nausea.  Vomiting.  Sore throat.  Trouble concentrating.  Feeling cold or chills.  Weak or tired.  Sleepiness and fatigue.  Soreness and body aches. These side effects can affect parts of the body that were not involved in surgery. Follow these instructions at home:  For at least 24 hours after the procedure:  Have a responsible adult stay with you. It is important to have someone help care for you until you are awake and alert.  Rest as needed.  Do not: ? Participate in activities in which you could fall or become injured. ? Drive. ? Use heavy machinery. ? Drink alcohol. ? Take sleeping pills or medicines that cause drowsiness. ? Make important decisions or sign legal documents. ? Take care of children on your own. Eating and drinking  Follow any instructions from your health care provider about eating or drinking restrictions.  When you feel hungry, start by eating small amounts of foods that are soft and easy to digest (bland), such as toast. Gradually return to your regular diet.  Drink enough fluid to keep your urine pale yellow.  If you vomit, rehydrate by drinking water, juice, or clear broth. General instructions  If you have sleep apnea, surgery and certain medicines can increase your risk for breathing problems. Follow instructions from your health care provider about wearing your sleep device: ? Anytime you are sleeping, including during daytime naps. ? While taking prescription pain medicines, sleeping medicines, or medicines that make you drowsy.  Return to  your normal activities as told by your health care provider. Ask your health care provider what activities are safe for you.  Take over-the-counter and prescription medicines only as told by your health care provider.  If you smoke, do not smoke without supervision.  Keep all follow-up visits as told by your health care provider. This is important. Contact a health care provider if:  You have nausea or vomiting that does not get better with medicine.  You cannot eat or drink without vomiting.  You have pain that does not get better with medicine.  You are unable to pass urine.  You develop a skin rash.  You have a fever.  You have redness around your IV site that gets worse. Get help right away if:  You have difficulty breathing.  You have chest pain.  You have blood in your urine or stool, or you vomit blood. Summary  After the procedure, it is common to have a sore throat or nausea. It is also common to feel tired.  Have a responsible adult stay with you for the first 24 hours after general anesthesia. It is important to have someone help care for you until you are awake and alert.  When you feel hungry, start by eating small amounts of foods that are soft and easy to digest (bland), such as toast. Gradually return to your regular diet.  Drink enough fluid to keep your urine pale yellow.  Return to your normal activities as told by your health care provider. Ask your health care provider what activities are safe for you. This information is not   intended to replace advice given to you by your health care provider. Make sure you discuss any questions you have with your health care provider. Document Revised: 05/30/2017 Document Reviewed: 01/10/2017 Elsevier Patient Education  2020 [employer]  

## 2020-01-26 NOTE — Progress Notes (Signed)
Patient came in today to pick up custom made foot orthotics.  The goals were accomplished and the patient reported no dissatisfaction with said orthotics.  Patient was advised of breakin period and how to report any issues. 

## 2020-01-27 ENCOUNTER — Other Ambulatory Visit: Payer: Self-pay

## 2020-01-27 ENCOUNTER — Ambulatory Visit: Payer: BC Managed Care – PPO | Admitting: Anesthesiology

## 2020-01-27 ENCOUNTER — Ambulatory Visit
Admission: RE | Admit: 2020-01-27 | Discharge: 2020-01-27 | Disposition: A | Discharge status: Home / Self Care | Payer: BC Managed Care – PPO | Attending: Gastroenterology | Admitting: Gastroenterology

## 2020-01-27 ENCOUNTER — Encounter: Payer: Self-pay | Admitting: Gastroenterology

## 2020-01-27 ENCOUNTER — Other Ambulatory Visit: Payer: Self-pay | Admitting: Gastroenterology

## 2020-01-27 ENCOUNTER — Encounter
Admission: RE | Disposition: A | Discharge status: Home / Self Care | Payer: Self-pay | Source: Home / Self Care | Attending: Gastroenterology

## 2020-01-27 DIAGNOSIS — Z1211 Encounter for screening for malignant neoplasm of colon: Secondary | ICD-10-CM

## 2020-01-27 DIAGNOSIS — K219 Gastro-esophageal reflux disease without esophagitis: Secondary | ICD-10-CM | POA: Diagnosis not present

## 2020-01-27 DIAGNOSIS — K640 First degree hemorrhoids: Secondary | ICD-10-CM | POA: Diagnosis not present

## 2020-01-27 DIAGNOSIS — D120 Benign neoplasm of cecum: Secondary | ICD-10-CM

## 2020-01-27 HISTORY — PX: COLONOSCOPY WITH PROPOFOL: SHX5780

## 2020-01-27 HISTORY — DX: Family history of other specified conditions: Z84.89

## 2020-01-27 HISTORY — DX: Gastro-esophageal reflux disease without esophagitis: K21.9

## 2020-01-27 HISTORY — PX: POLYPECTOMY: SHX5525

## 2020-01-27 SURGERY — COLONOSCOPY WITH PROPOFOL
Anesthesia: Monitor Anesthesia Care | Site: Rectum

## 2020-01-27 MED ORDER — LACTATED RINGERS IV SOLN: INTRAVENOUS | Status: DC

## 2020-01-27 MED ORDER — LIDOCAINE HCL (CARDIAC) PF 100 MG/5ML IV SOSY
PREFILLED_SYRINGE | INTRAVENOUS | Status: DC | PRN
  Administered 2020-01-27: 30 mg via INTRAVENOUS

## 2020-01-27 MED ORDER — SODIUM CHLORIDE 0.9 % IV SOLN: INTRAVENOUS | Status: DC

## 2020-01-27 MED ORDER — PROPOFOL 10 MG/ML IV BOLUS
INTRAVENOUS | Status: DC | PRN
  Administered 2020-01-27: 30 mg via INTRAVENOUS
  Administered 2020-01-27: 140 mg via INTRAVENOUS
  Administered 2020-01-27 (×2): 30 mg via INTRAVENOUS

## 2020-01-27 MED ORDER — STERILE WATER FOR IRRIGATION IR SOLN: Status: DC | PRN

## 2020-01-27 SURGICAL SUPPLY — 7 items
GOWN CVR UNV OPN BCK APRN NK (MISCELLANEOUS) ×2 IMPLANT
GOWN ISOL THUMB LOOP REG UNIV (MISCELLANEOUS) ×6
KIT ENDO PROCEDURE OLY (KITS) ×3 IMPLANT
MANIFOLD NEPTUNE II (INSTRUMENTS) ×3 IMPLANT
SNARE SHORT THROW 13M SML OVAL (MISCELLANEOUS) ×2 IMPLANT
TRAP ETRAP POLY (MISCELLANEOUS) ×2 IMPLANT
WATER STERILE IRR 250ML POUR (IV SOLUTION) ×3 IMPLANT

## 2020-01-27 NOTE — Anesthesia Preprocedure Evaluation (Addendum)
Anesthesia Evaluation  Patient identified by MRN, date of birth, ID band Patient awake    Reviewed: Allergy & Precautions, H&P , NPO status , Patient's Chart, lab work & pertinent test results  Airway Mallampati: II  TM Distance: >3 FB Neck ROM: full    Dental no notable dental hx.    Pulmonary    Pulmonary exam normal breath sounds clear to auscultation       Cardiovascular Normal cardiovascular exam Rhythm:regular Rate:Normal     Neuro/Psych    GI/Hepatic GERD  ,  Endo/Other    Renal/GU      Musculoskeletal   Abdominal   Peds  Hematology   Anesthesia Other Findings   Reproductive/Obstetrics                             Anesthesia Physical Anesthesia Plan  ASA: II  Anesthesia Plan: General   Post-op Pain Management:    Induction: Intravenous  PONV Risk Score and Plan: 3 and Treatment may vary due to age or medical condition, TIVA and Propofol infusion  Airway Management Planned: Natural Airway  Additional Equipment:   Intra-op Plan:   Post-operative Plan:   Informed Consent: I have reviewed the patients History and Physical, chart, labs and discussed the procedure including the risks, benefits and alternatives for the proposed anesthesia with the patient or authorized representative who has indicated his/her understanding and acceptance.     Dental Advisory Given  Plan Discussed with: CRNA  Anesthesia Plan Comments:         Anesthesia Quick Evaluation  

## 2020-01-27 NOTE — Anesthesia Procedure Notes (Signed)
Performed by: Meighan Treto, CRNA Pre-anesthesia Checklist: Patient identified, Emergency Drugs available, Suction available, Timeout performed and Patient being monitored Patient Re-evaluated:Patient Re-evaluated prior to induction Oxygen Delivery Method: Nasal cannula Placement Confirmation: positive ETCO2       

## 2020-01-27 NOTE — H&P (Signed)
Lucilla Lame, MD [address] [place], [place] [address] Phone: [phone] Fax : [phone]  Primary Care Physician:  Juline Patch, MD Primary Gastroenterologist:  Dr. Allen Norris  Pre-Procedure History & Physical: HPI:  Karen Kelly is a 54 y.o. female is here for a screening colonoscopy.   Past Medical History:  Diagnosis Date  . Family history of adverse reaction to anesthesia    hard time waking up (mom)  . GERD (gastroesophageal reflux disease)     Past Surgical History:  Procedure Laterality Date  . FOOT SURGERY      Prior to Admission medications   Medication Sig Start Date End Date Taking? Authorizing Provider  triamcinolone cream (KENALOG) 0.1 % Apply 1 application topically 2 (two) times daily. 12/06/19  Yes Juline Patch, MD    Allergies as of 12/14/2019  . (No Known Allergies)    History reviewed. No pertinent family history.  Social History   Socioeconomic History  . Marital status: Married    Spouse name: Not on file  . Number of children: Not on file  . Years of education: Not on file  . Highest education level: Not on file  Occupational History  . Not on file  Tobacco Use  . Smoking status: Never Smoker  . Smokeless tobacco: Never Used  Substance and Sexual Activity  . Alcohol use: Yes    Alcohol/week: 6.0 standard drinks    Types: 6 Glasses of wine per week  . Drug use: No  . Sexual activity: Not on file  Other Topics Concern  . Not on file  Social History Narrative  . Not on file   Social Determinants of Health   Financial Resource Strain:   . Difficulty of Paying Living Expenses: Not on file  Food Insecurity:   . Worried About Charity fundraiser in the Last Year: Not on file  . Ran Out of Food in the Last Year: Not on file  Transportation Needs:   . Lack of Transportation (Medical): Not on file  . Lack of Transportation (Non-Medical): Not on file  Physical Activity:   . Days of Exercise per Week: Not on  file  . Minutes of Exercise per Session: Not on file  Stress:   . Feeling of Stress : Not on file  Social Connections:   . Frequency of Communication with Friends and Family: Not on file  . Frequency of Social Gatherings with Friends and Family: Not on file  . Attends Religious Services: Not on file  . Active Member of Clubs or Organizations: Not on file  . Attends Archivist Meetings: Not on file  . Marital Status: Not on file  Intimate Partner Violence:   . Fear of Current or Ex-Partner: Not on file  . Emotionally Abused: Not on file  . Physically Abused: Not on file  . Sexually Abused: Not on file    Review of Systems: See HPI, otherwise negative ROS  Physical Exam: BP 98/74   Pulse 65   Temp 97.8 F (36.6 C) (Temporal)   Resp 16   Ht 5\' 3"  (1.6 m)   Wt 46.7 kg   SpO2 100%   BMI 18.25 kg/m  General:   Alert,  pleasant and cooperative in NAD Head:  Normocephalic and atraumatic. Neck:  Supple; no masses or thyromegaly. Lungs:  Clear throughout to auscultation.    Heart:  Regular rate and rhythm. Abdomen:  Soft, nontender and nondistended. Normal bowel sounds, without  guarding, and without rebound.   Neurologic:  Alert and  oriented x4;  grossly normal neurologically.  Impression/Plan: Karen Kelly is now here to undergo a screening colonoscopy.  Risks, benefits, and alternatives regarding colonoscopy have been reviewed with the patient.  Questions have been answered.  All parties agreeable.

## 2020-01-27 NOTE — Transfer of Care (Signed)
Immediate Anesthesia Transfer of Care Note  Patient: Karen Kelly  Procedure(s) Performed: COLONOSCOPY WITH BIOPSY (N/A Rectum) POLYPECTOMY (N/A Rectum)  Patient Location: PACU  Anesthesia Type: MAC  Level of Consciousness: awake, alert  and patient cooperative  Airway and Oxygen Therapy: Patient Spontanous Breathing and Patient connected to supplemental oxygen  Post-op Assessment: Post-op Vital signs reviewed, Patient's Cardiovascular Status Stable, Respiratory Function Stable, Patent Airway and No signs of Nausea or vomiting  Post-op Vital Signs: Reviewed and stable  Complications: No complications documented.

## 2020-01-27 NOTE — Op Note (Signed)
[facility] Gastroenterology Patient Name: Karen Kelly Procedure Date: 01/27/2020 7:33 AM MRN: [id number] Account #: [id number] Date of Birth: 20-Jun-1965 Admit Type: Outpatient Age: 54 Room: [facility] OR ROOM 01 Gender: Female Note Status: Finalized Procedure:             Colonoscopy Indications:           Screening for colorectal malignant neoplasm Providers:             Lucilla Lame MD, MD Referring MD:          Juline Patch, MD (Referring MD) Medicines:             Propofol per Anesthesia Complications:         No immediate complications. Procedure:             Pre-Anesthesia Assessment:                        - Prior to the procedure, a History and Physical was                         performed, and patient medications and allergies were                         reviewed. The patient's tolerance of previous                         anesthesia was also reviewed. The risks and benefits                         of the procedure and the sedation options and risks                         were discussed with the patient. All questions were                         answered, and informed consent was obtained. Prior                         Anticoagulants: The patient has taken no previous                         anticoagulant or antiplatelet agents. ASA Grade                         Assessment: II - A patient with mild systemic disease.                         After reviewing the risks and benefits, the patient                         was deemed in satisfactory condition to undergo the                         procedure.                        After obtaining informed consent, the colonoscope was  passed under direct vision. Throughout the procedure,                         the patient's blood pressure, pulse, and oxygen                         saturations were monitored continuously. The was                         introduced through the anus and  advanced to the the                         cecum, identified by appendiceal orifice and ileocecal                         valve. The colonoscopy was performed without                         difficulty. The patient tolerated the procedure well.                         The quality of the bowel preparation was excellent. Findings:      The perianal and digital rectal examinations were normal.      A 6 mm polyp was found in the cecum. The polyp was sessile. The polyp       was removed with a cold snare. Resection and retrieval were complete.      Non-bleeding internal hemorrhoids were found during retroflexion. The       hemorrhoids were Grade I (internal hemorrhoids that do not prolapse). Impression:            - One 6 mm polyp in the cecum, removed with a cold                         snare. Resected and retrieved.                        - Non-bleeding internal hemorrhoids. Recommendation:        - Discharge patient to home.                        - Resume previous diet.                        - Continue present medications.                        - Await pathology results.                        - Repeat colonoscopy in 7 years if polyp adenoma and                         10 years if hyperplastic Procedure Code(s):     --- Professional ---                        [phone], Colonoscopy, flexible; with removal of  tumor(s), polyp(s), or other lesion(s) by snare                         technique Diagnosis Code(s):     --- Professional ---                        Z12.11, Encounter for screening for malignant neoplasm                         of colon                        K63.5, Polyp of colon CPT copyright 2019 American Medical Association. All rights reserved. The codes documented in this report are preliminary and upon coder review may  be revised to meet current compliance requirements. Lucilla Lame MD, MD 01/27/2020 8:05:26 AM This report has been signed  electronically. Number of Addenda: 0 Note Initiated On: 01/27/2020 7:33 AM Scope Withdrawal Time: 0 hours 8 minutes 34 seconds  Total Procedure Duration: 0 hours 15 minutes 42 seconds  Estimated Blood Loss:  Estimated blood loss: none.      [facility]

## 2020-01-27 NOTE — Anesthesia Postprocedure Evaluation (Signed)
Anesthesia Post Note  Patient: Karen Kelly Wire  Procedure(s) Performed: COLONOSCOPY WITH BIOPSY (N/A Rectum) POLYPECTOMY (N/A Rectum)     Patient location during evaluation: PACU Anesthesia Type: MAC Level of consciousness: awake and alert and oriented Pain management: satisfactory to patient Vital Signs Assessment: post-procedure vital signs reviewed and stable Respiratory status: spontaneous breathing, nonlabored ventilation and respiratory function stable Cardiovascular status: blood pressure returned to baseline and stable Postop Assessment: Adequate PO intake and No signs of nausea or vomiting Anesthetic complications: no   No complications documented.  Raliegh Ip

## 2020-01-28 ENCOUNTER — Encounter: Payer: Self-pay | Admitting: Gastroenterology

## 2020-01-28 LAB — SURGICAL PATHOLOGY

## 2020-01-31 ENCOUNTER — Encounter: Payer: Self-pay | Admitting: Gastroenterology

## 2020-02-01 LAB — PATHOLOGY

## 2020-02-02 ENCOUNTER — Other Ambulatory Visit: Payer: Self-pay | Admitting: Obstetrics and Gynecology

## 2020-02-02 DIAGNOSIS — Z9189 Other specified personal risk factors, not elsewhere classified: Secondary | ICD-10-CM

## 2020-02-29 ENCOUNTER — Ambulatory Visit
Admission: RE | Admit: 2020-02-29 | Discharge: 2020-02-29 | Disposition: A | Discharge status: Home / Self Care | Payer: BC Managed Care – PPO | Source: Ambulatory Visit | Attending: Obstetrics and Gynecology | Admitting: Obstetrics and Gynecology

## 2020-02-29 ENCOUNTER — Other Ambulatory Visit: Payer: Self-pay

## 2020-02-29 DIAGNOSIS — Z9189 Other specified personal risk factors, not elsewhere classified: Secondary | ICD-10-CM

## 2020-02-29 MED ORDER — GADOBUTROL 1 MMOL/ML IV SOLN
5.0000 mL | Freq: Once | INTRAVENOUS | Status: AC | PRN
  Administered 2020-02-29: 5 mL via INTRAVENOUS

## 2021-03-16 ENCOUNTER — Other Ambulatory Visit: Payer: Self-pay | Admitting: Obstetrics and Gynecology

## 2021-03-16 DIAGNOSIS — Z9189 Other specified personal risk factors, not elsewhere classified: Secondary | ICD-10-CM

## 2021-04-02 ENCOUNTER — Other Ambulatory Visit: Payer: BC Managed Care – PPO

## 2021-06-18 ENCOUNTER — Other Ambulatory Visit: Payer: BC Managed Care – PPO

## 2021-07-16 ENCOUNTER — Ambulatory Visit
Admission: RE | Admit: 2021-07-16 | Discharge: 2021-07-16 | Disposition: A | Discharge status: Home / Self Care | Payer: BC Managed Care – PPO | Source: Ambulatory Visit | Attending: Obstetrics and Gynecology | Admitting: Obstetrics and Gynecology

## 2021-07-16 DIAGNOSIS — Z9189 Other specified personal risk factors, not elsewhere classified: Secondary | ICD-10-CM

## 2021-07-16 MED ORDER — GADOBUTROL 1 MMOL/ML IV SOLN
5.0000 mL | Freq: Once | INTRAVENOUS | Status: AC | PRN
  Administered 2021-07-16: 5 mL via INTRAVENOUS

## 2021-08-14 ENCOUNTER — Other Ambulatory Visit: Payer: Self-pay

## 2021-08-14 ENCOUNTER — Encounter: Payer: Self-pay | Admitting: Family Medicine

## 2021-08-14 ENCOUNTER — Ambulatory Visit: Payer: BC Managed Care – PPO | Admitting: Family Medicine

## 2021-08-14 VITALS — BP 100/60 | HR 80 | Ht 63.0 in | Wt 109.0 lb

## 2021-08-14 DIAGNOSIS — R350 Frequency of micturition: Secondary | ICD-10-CM | POA: Diagnosis not present

## 2021-08-14 LAB — POCT URINALYSIS DIPSTICK
Bilirubin, UA: NEGATIVE
Blood, UA: NEGATIVE
Glucose, UA: NEGATIVE
Ketones, UA: NEGATIVE
Leukocytes, UA: NEGATIVE
Nitrite, UA: NEGATIVE
Protein, UA: NEGATIVE
Spec Grav, UA: 1.01 (ref 1.010–1.025)
Urobilinogen, UA: 0.2 E.U./dL
pH, UA: 5 (ref 5.0–8.0)

## 2021-08-14 MED ORDER — OXYBUTYNIN CHLORIDE ER 5 MG PO TB24: 5.0000 mg | ORAL_TABLET | Freq: Every day | ORAL | 2 refills | Status: DC

## 2021-08-14 NOTE — Patient Instructions (Addendum)
Kegel Exercises Kegel exercises can help strengthen your pelvic floor muscles. The pelvic floor is a group of muscles that support your rectum, small intestine, and bladder. In females, pelvic floor muscles also help support the uterus. These muscles help you control the flow of urine and stool (feces). Kegel exercises are painless and simple. They do not require any equipment. Your provider may suggest Kegel exercises to: Improve bladder and bowel control. Improve sexual response. Improve weak pelvic floor muscles after surgery to remove the uterus (hysterectomy) or after pregnancy, in females. Improve weak pelvic floor muscles after prostate gland removal or surgery, in males. Kegel exercises involve squeezing your pelvic floor muscles. These are the same muscles you squeeze when you try to stop the flow of urine or keep from passing gas. The exercises can be done while sitting, standing, or lying down, but it is best to vary your position. Ask your health care provider which exercises are safe for you. Do exercises exactly as told by your health care provider and adjust them as directed. Do not begin these exercises until told by your health care provider. Exercises How to do Kegel exercises: Squeeze your pelvic floor muscles tight. You should feel a tight lift in your rectal area. If you are a female, you should also feel a tightness in your vaginal area. Keep your stomach, buttocks, and legs relaxed. Hold the muscles tight for up to 10 seconds. Breathe normally. Relax your muscles for up to 10 seconds. Repeat as told by your health care provider. Repeat this exercise daily as told by your health care provider. Continue to do this exercise for at least 4-6 weeks, or for as long as told by your health care provider. You may be referred to a physical therapist who can help you learn more about how to do Kegel exercises. Depending on your condition, your health care provider may  recommend: Varying how long you squeeze your muscles. Doing several sets of exercises every day. Doing exercises for several weeks. Making Kegel exercises a part of your regular exercise routine. This information is not intended to replace advice given to you by your health care provider. Make sure you discuss any questions you have with your health care provider. Document Revised: 10/05/2020 Document Reviewed: 10/05/2020 Elsevier Patient Education  2022 [place]. Overactive Bladder, Adult Overactive bladder is a condition in which a person has a sudden and frequent need to urinate. A person might also leak urine if he or she cannot get to the bathroom fast enough (urinary incontinence). Sometimes, symptoms can interfere with work or social activities. What are the causes? Overactive bladder is associated with poor nerve signals between your bladder and your brain. Your bladder may get the signal to empty before it is full. You may also have very sensitive muscles that make your bladder squeeze too soon. This condition may also be caused by other factors, such as: Medical conditions: Urinary tract infection. Infection of nearby tissues. Prostate enlargement. Bladder stones, inflammation, or tumors. Diabetes. Muscle or nerve weakness, especially from these conditions: A spinal cord injury. Stroke. Multiple sclerosis. Parkinson's disease. Other causes: Surgery on the uterus or urethra. Drinking too much caffeine or alcohol. Certain medicines, especially those that eliminate extra fluid in the body (diuretics). Constipation. What increases the risk? You may be at greater risk for overactive bladder if you: Are an older adult. Smoke. Are going through menopause. Have prostate problems. Have a neurological disease, such as stroke, dementia, Parkinson's disease, or multiple  sclerosis (MS). Eat or drink alcohol, spicy food, caffeine, and other things that irritate the bladder. Are  overweight or obese. What are the signs or symptoms? Symptoms of this condition include a sudden, strong urge to urinate. Other symptoms include: Leaking urine. Urinating 8 or more times a day. Waking up to urinate 2 or more times overnight. How is this diagnosed? This condition may be diagnosed based on: Your symptoms and medical history. A physical exam. Blood or urine tests to check for possible causes, such as infection. You may also need to see a health care provider who specializes in urinary tract problems. This is called a urologist. How is this treated? Treatment for overactive bladder depends on the cause of your condition and whether it is mild or severe. Treatment may include: Bladder training, such as: Learning to control the urge to urinate by following a schedule to urinate at regular intervals. Doing Kegel exercises to strengthen the pelvic floor muscles that support your bladder. Special devices, such as: Biofeedback. This uses sensors to help you become aware of your body's signals. Electrical stimulation. This uses electrodes placed inside the body (implanted) or outside the body. These electrodes send gentle pulses of electricity to strengthen the nerves or muscles that control the bladder. Women may use a plastic device, called a pessary, that fits into the vagina and supports the bladder. Medicines, such as: Antibiotics to treat bladder infection. Antispasmodics to stop the bladder from releasing urine at the wrong time. Tricyclic antidepressants to relax bladder muscles. Injections of botulinum toxin type A directly into the bladder tissue to relax bladder muscles. Surgery, such as: A device may be implanted to help manage the nerve signals that control urination. An electrode may be implanted to stimulate electrical signals in the bladder. A procedure may be done to change the shape of the bladder. This is done only in very severe cases. Follow these instructions  at home: Eating and drinking  Make diet or lifestyle changes recommended by your health care provider. These may include: Drinking fluids throughout the day and not only with meals. Cutting down on caffeine or alcohol. Eating a healthy and balanced diet to prevent constipation. This may include: Choosing foods that are high in fiber, such as beans, whole grains, and fresh fruits and vegetables. Limiting foods that are high in fat and processed sugars, such as fried and sweet foods. Lifestyle  Lose weight if needed. Do not use any products that contain nicotine or tobacco. These include cigarettes, chewing tobacco, and vaping devices, such as e-cigarettes. If you need help quitting, ask your health care provider. General instructions Take over-the-counter and prescription medicines only as told by your health care provider. If you were prescribed an antibiotic medicine, take it as told by your health care provider. Do not stop taking the antibiotic even if you start to feel better. Use any implants or pessary as told by your health care provider. If needed, wear pads to absorb urine leakage. Keep a log to track how much and when you drink, and when you need to urinate. This will help your health care provider monitor your condition. Keep all follow-up visits. This is important. Contact a health care provider if: You have a fever or chills. Your symptoms do not get better with treatment. Your pain and discomfort get worse. You have more frequent urges to urinate. Get help right away if: You are not able to control your bladder. Summary Overactive bladder refers to a condition in which  a person has a sudden and frequent need to urinate. Several conditions may lead to an overactive bladder. Treatment for overactive bladder depends on the cause and severity of your condition. Making lifestyle changes, doing Kegel exercises, keeping a log, and taking medicines can help with this  condition. This information is not intended to replace advice given to you by your health care provider. Make sure you discuss any questions you have with your health care provider. Document Revised: 02/14/2020 Document Reviewed: 02/14/2020 Elsevier Patient Education  2022 [place]  LOW-CHOLESTEROL, LOW-TRIGLYCERIDE DIETS    FOODS TO USE   MEATS, FISH Choose lean meats (chicken, [place], veal, and non-fatty cuts of beef with excess fat trimmed; one serving = 3 oz of cooked meat). Also, fresh or frozen fish, canned fish packed in water, and shellfish (lobster, crabs, shrimp, and oysters). Limit use to no more than one serving of one of these per week. Shellfish are high in cholesterol but low in saturated fat and should be used sparingly. Meats and fish should be broiled (pan or oven) or baked on a rack.  EGGS Egg substitutes and egg whites (use freely). Egg yolks (limit two per week).  FRUITS Eat three servings of fresh fruit per day (1 serving =  cup). Be sure to have at least one citrus fruit daily. Frozen and canned fruit with no sugar or syrup added may be used.  VEGETABLES Most vegetables are not limited (see next page). One dark-green (string beans, escarole) or one deep yellow (squash) vegetable is recommended daily. Cauliflower, broccoli, and celery, as well as potato skins, are recommended for their fiber content. (Fiber is associated with cholesterol reduction) It is preferable to steam vegetables, but they may be boiled, strained, or braised with polyunsaturated vegetable oil (see below).  BEANS Dried peas or beans (1 serving =  cup) may be used as a bread substitute.  NUTS Almonds, walnuts, and peanuts may be used sparingly  (1 serving = 1 Tablespoonful). Use pumpkin, sesame, or sunflower seeds.  BREADS, GRAINS One roll or one slice of whole grain or enriched bread may be used, or three soda crackers or four pieces of melba toast as a substitute. Spaghetti, rice or  noodles ( cup) or  large ear of corn may be used as a bread substitute. In preparing these foods do not use butter or shortening, use soft margarine. Also use egg and sugar substitutes.  Choose high fiber grains, such as oats and whole wheat.  CEREALS Use  cup of hot cereal or  cup of cold cereal per day. Add a sugar substitute if desired, with 99% fat free or skim milk.  MILK PRODUCTS Always use 99% fat free or skim milk, dairy products such as low fat cheeses (farmer's uncreamed diet cottage), low-fat yogurt, and powdered skim milk.  FATS, OILS Use soft (not stick) margarine; vegetable oils that are high in polyunsaturated fats (such as safflower, sunflower, soybean, corn, and cottonseed). Always refrigerate meat drippings to harden the fat and remove it before preparing gravies  DESSERTS, SNACKS Limit to two servings per day; substitute each serving for a bread/cereal serving: ice milk, water sherbet (1/4 cup); unflavored gelatin or gelatin flavored with sugar substitute (1/3 cup); pudding prepared with skim milk (1/2 cup); egg white souffls; unbuttered popcorn (1  cups). Substitute carob for chocolate.  BEVERAGES Fresh fruit juices (limit 4 oz per day); black coffee, plain or herbal teas; soft drinks with sugar substitutes; club soda, preferably salt-free; cocoa  made with skim milk or nonfat dried milk and water (sugar substitute added if desired); clear broth. Alcohol: limit two servings per day (see second page).  MISCELLANEOUS  You may use the following freely: vinegar, spices, herbs, nonfat bouillon, mustard, Worcestershire sauce, soy sauce, flavoring essence.                  GUIDELINES FOR  LOW-CHOLESTEROL, LOW TRIGLYCERIDE DIETS    FOODS TO AVOID   MEATS, FISH Marbled beef, pork, bacon, sausage, and other pork products; fatty fowl (duck, goose); skin and fat of [place] and chicken; processed meats; luncheon meats (salami, bologna); frankfurters and fast-food hamburgers  (theyre loaded with fat); organ meats (kidneys, liver); canned fish packed in oil.  EGGS Limit egg yolks to two per week.   FRUITS Coconuts (rich in saturated fats).  VEGETABLES Avoid avocados. Starchy vegetables (potatoes, corn, lima beans, dried peas, beans) may be used only if substitutes for a serving of bread or cereal. (Baked potato skin, however, is desirable for its fiber content.  BEANS Commercial baked beans with sugar and/or pork added.  NUTS Avoid nuts.  Limit peanuts and walnuts to one tablespoonful per day.  BREADS, GRAINS Any baked goods with shortening and/or sugar. Commercial mixes with dried eggs and whole milk. Avoid sweet rolls, doughnuts, breakfast pastries (Danish), and sweetened packaged cereals (the added sugar converts readily to triglycerides).  MILK PRODUCTS Whole milk and whole-milk packaged goods; cream; ice cream; whole-milk puddings, yogurt, or cheeses; nondairy cream substitutes.  FATS, OILS Butter, lard, animal fats, bacon drippings, gravies, cream sauces as well as palm and coconut oils. All these are high in saturated fats. Examine labels on cholesterol free products for hydrogenated fats. (These are oils that have been hardened into solids and in the process have become saturated.)  DESSERTS, SNACKS Fried snack foods like potato chips; chocolate; candies in general; jams, jellies, syrups; whole- milk puddings; ice cream and milk sherbets; hydrogenated peanut butter.  BEVERAGES Sugared fruit juices and soft drinks; cocoa made with whole milk and/or sugar. When using alcohol (1 oz liquor, 5 oz beer, or 2  oz dry table wine per serving), one serving must be substituted for one bread or cereal serving (limit, two servings of alcohol per day).   SPECIAL NOTES    Remember that even non-limited foods should be used in moderation. While on a cholesterol-lowering diet, be sure to avoid animal fats and marbled meats. 3. While on a triglyceride-lowering diet, be sure to avoid  sweets and to control the amount of carbohydrates you eat (starchy foods such as flour, bread, potatoes).While on a tri-glyceride-lowering diet, be sure to avoid sweets Buy a good low-fat cookbook, such as the one published by the American Heart Association. Consult your physician if you have any questions.               [facility] Low Glycemic Diet Plan   Low Glycemic Foods (20-49) Moderate Glycemic Foods (50-69) High Glycemic Foods (70-100)      Breakfast Creals Breakfast Cereals Breakfast Cereals  All Bran All-Bran Fruit'n Oats   Bran Buds Bran Chex   Cheerios Corn chex    Fiber One Oatmeal (not instant)   Just Right Mini-Wheats   Corn Flakes Cream of Wheat    Oat Bran Special K Swiss Muesli   Grape Nuts Grape Nut Flakes      Grits Nutri-Grain    Fruits and fruit juice: Fruits Puffed Rice Puffed Wheat    (Limit  to 1-2 Servings per day) Banana (under-ride) Dates   Rice Chex Rice Krispies    Apples Apricots (fresh/dried)   Figs Grapes   Shredded Wheat Team    Blackberries Blueberries   Kiwi Mango   Total     Cherries Cranberries   Oranges Raisins     Peaches Pears    Fruits  Plums Prunes   Fruit Juices Pineapple Watermelon    Grapefruit Raspberries   Cranberry Juice Orange Juice   Banana (over-ripe)     Strawberries Tangerines      Apple Juice Grapefruit Juice   Beans and Legumes Beverages  Tomato Juice    [place]-type baked beans Sodas, sweet tea, pineapple juice   Canned pinto, kidney, or navy beans   Beans and Legumes (fresh-cooked) Green peas Vegetables  Black-eyed peas Butter Beans    Potato, baked, boiled, fried, mashed  Chick peas Lentils   Vegetables French fries  Green beans Lima beans   Beets Carrots   Canned or frozen corn  Kidney beans Navy beans   Sweet potato Yam   Parsnips  Pinto beans Snow peas   Corn on the cob Winter squash      Non-starchy vegetables Grains Breads  Asparagus, avocado, broccoli, cabbage  Cornmeal Rice, brown   Most breads (white and whole grain)  cauliflower, celery, cucumber, greens Rice, white Couscous   Bagels Bread sticks    lettuce, mushrooms, peppers, tomatoes  Bread stuffing Kaiser roll    okra, onions, spinach, summer squash Pasta Dinner rolls   [employer], cheese     Grains Ravioli, meat filled Spaghetti, white   Grains  Barley Bulgur    Rice, instant Tapioca, with milk    Rye Wild rice   Nuts    Cashews Macadamia   Candy and most cookies  Nuts and oils    Almonds, peanuts, sunflower seeds Snacks Snacks  hazelnuts, pecans, walnuts Chocolate Ice cream, lowfat   Donuts Corn chips    Oils that are liquid at room temperature Muffin Popcorn   Jelly beans Pretzels      Pastries  Dairy, fish, meat, soy, and eggs    Milk, skim Lowfat cheese    Restaurant and ethnic foods  Yogurt, lowfat, fruit sugar sweetened  Most [place] food (sugar in stir fry    or wok sauce)  Lean red meat Fish    Teriyaki-style meats and vegetables  Skinless chicken and [place], shellfish        Egg whites (up to 3 daily), Soy Products    Egg yolks (up to 7 or _____ per week)

## 2021-08-14 NOTE — Progress Notes (Signed)
? ? ?Date:  08/14/2021  ? ?Name:  Karen Kelly   DOB:  [birth year]   MRN:  [id number] ? ? ?Chief Complaint: Urinary Frequency (No burning, but having episodes of frequency and will drink cranberry juice . Gets better for a couple of days and comes back) ? ?Urinary Frequency  ?This is a chronic problem. The current episode started more than 1 year ago. The problem occurs intermittently. The problem has been waxing and waning. Quality: pressure. The pain is mild. Associated symptoms include frequency. Pertinent negatives include no chills, discharge, flank pain, hematuria, hesitancy, nausea or urgency. Treatments tried: acidification. The treatment provided mild relief.  ? ?No results found for: NA, K, CO2, GLUCOSE, BUN, CREATININE, CALCIUM, EGFR, GFRNONAA, GLUCOSE ?No results found for: CHOL, HDL, LDLCALC, LDLDIRECT, TRIG, CHOLHDL ?No results found for: TSH ?No results found for: HGBA1C ?No results found for: WBC, HGB, HCT, MCV, PLT ?No results found for: ALT, AST, GGT, ALKPHOS, BILITOT ?No results found for: 25OHVITD2, [place], VD25OH  ? ?Review of Systems  ?Constitutional:  Negative for chills and fever.  ?HENT:  Negative for drooling, ear discharge, ear pain and sore throat.   ?Respiratory:  Negative for cough, shortness of breath and wheezing.   ?Cardiovascular:  Negative for chest pain, palpitations and leg swelling.  ?Gastrointestinal:  Negative for abdominal pain, blood in stool, constipation, diarrhea and nausea.  ?Endocrine: Negative for polydipsia.  ?Genitourinary:  Positive for frequency. Negative for dysuria, flank pain, hematuria, hesitancy and urgency.  ?Musculoskeletal:  Negative for back pain, myalgias and neck pain.  ?Skin:  Negative for rash.  ?Allergic/Immunologic: Negative for environmental allergies.  ?Neurological:  Negative for dizziness and headaches.  ?Hematological:  Does not bruise/bleed easily.  ?Psychiatric/Behavioral:  Negative for suicidal ideas. The patient is not nervous/anxious.    ? ?Patient Active Problem List  ? Diagnosis Date Noted  ? Encounter for screening colonoscopy   ? Benign neoplasm of cecum   ? At high risk for breast cancer 06/26/2018  ? ? ?No Known Allergies ? ?Past Surgical History:  ?Procedure Laterality Date  ? COLONOSCOPY WITH PROPOFOL N/A 01/27/2020  ? Procedure: COLONOSCOPY WITH BIOPSY;  Surgeon: Lucilla Lame, MD;  Location: [facility];  Service: Endoscopy;  Laterality: N/A;  priority 4  ? FOOT SURGERY    ? POLYPECTOMY N/A 01/27/2020  ? Procedure: POLYPECTOMY;  Surgeon: Lucilla Lame, MD;  Location: [facility];  Service: Endoscopy;  Laterality: N/A;  ? ? ?Social History  ? ?Tobacco Use  ? Smoking status: Never  ? Smokeless tobacco: Never  ?Substance Use Topics  ? Alcohol use: Yes  ?  Alcohol/week: 6.0 standard drinks  ?  Types: 6 Glasses of wine per week  ? Drug use: No  ? ? ? ?Medication list has been reviewed and updated. ? ?No outpatient medications have been marked as taking for the 08/14/21 encounter (Office Visit) with Juline Patch, MD.  ? ? ?PHQ 2/9 Scores 08/14/2021 12/06/2019  ?PHQ - 2 Score 0 0  ?PHQ- 9 Score - 0  ? ? ?GAD 7 : Generalized Anxiety Score 12/06/2019  ?Nervous, Anxious, on Edge 0  ?Control/stop worrying 0  ?Worry too much - different things 0  ?Trouble relaxing 0  ?Restless 0  ?Easily annoyed or irritable 0  ?Afraid - awful might happen 0  ?Total GAD 7 Score 0  ? ? ?BP Readings from Last 3 Encounters:  ?08/14/21 100/60  ?01/27/20 (!) 83/52  ?12/06/19 120/72  ? ? ?Physical Exam ?Vitals  and nursing note reviewed.  ?Constitutional:   ?   Appearance: She is well-developed.  ?HENT:  ?   Head: Normocephalic.  ?   Right Ear: Tympanic membrane and external ear normal.  ?   Left Ear: Tympanic membrane and external ear normal.  ?Eyes:  ?   General: Lids are everted, no foreign bodies appreciated. No scleral icterus.    ?   Left eye: No foreign body or hordeolum.  ?   Conjunctiva/sclera: Conjunctivae normal.  ?   Right eye: Right conjunctiva is not  injected.  ?   Left eye: Left conjunctiva is not injected.  ?   Pupils: Pupils are equal, round, and reactive to light.  ?Neck:  ?   Thyroid: No thyromegaly.  ?   Vascular: No JVD.  ?   Trachea: No tracheal deviation.  ?Cardiovascular:  ?   Rate and Rhythm: Normal rate and regular rhythm.  ?   Heart sounds: Normal heart sounds. No murmur heard. ?  No friction rub. No gallop.  ?Pulmonary:  ?   Effort: Pulmonary effort is normal. No respiratory distress.  ?   Breath sounds: Normal breath sounds. No wheezing or rales.  ?Abdominal:  ?   General: Bowel sounds are normal.  ?   Palpations: Abdomen is soft. There is no mass.  ?   Tenderness: There is no abdominal tenderness. There is no guarding or rebound.  ?Musculoskeletal:     ?   General: No tenderness. Normal range of motion.  ?   Cervical back: Normal range of motion and neck supple.  ?Lymphadenopathy:  ?   Cervical: No cervical adenopathy.  ?Skin: ?   General: Skin is warm.  ?   Findings: No rash.  ?Neurological:  ?   Mental Status: She is alert and oriented to person, place, and time.  ?   Cranial Nerves: No cranial nerve deficit.  ?   Deep Tendon Reflexes: Reflexes normal.  ?Psychiatric:     ?   Mood and Affect: Mood is not anxious or depressed.  ? ? ?Wt Readings from Last 3 Encounters:  ?08/14/21 109 lb (49.4 kg)  ?01/27/20 103 lb (46.7 kg)  ?12/06/19 105 lb (47.6 kg)  ? ? ?BP 100/60   Pulse 80   Ht 5' 3" (1.6 m)   Wt 109 lb (49.4 kg)   BMI 19.31 kg/m?  ? ?Assessment and Plan: ? ?1. Urinary frequency ?Subacute.  Persistent.  Onset for about a year.  Patient has sensations of pressure of the bladder requiring urination.  Does not exceed 8 times a day and it does not exceed twice at night.  She has treated it with cranberry juice in the past so is unlikely to be infection and urinalysis did not reveal any infection today.  Beginning to wonder if it may be more of an overactive bladder concern patient has been given information, suggested Kegel exercises, and  trial of Ditropan 5 mg XL once a day for therapeutic and diagnostic approach.  Upon return from [place] has been suggested that we may need to involve urology/gynecology for further evaluation. ?- POCT urinalysis dipstick  ? ? ?

## 2021-10-29 ENCOUNTER — Ambulatory Visit (INDEPENDENT_AMBULATORY_CARE_PROVIDER_SITE_OTHER): Payer: BC Managed Care – PPO | Admitting: Family Medicine

## 2021-10-29 ENCOUNTER — Encounter: Payer: Self-pay | Admitting: Family Medicine

## 2021-10-29 VITALS — BP 100/60 | HR 76 | Ht 63.0 in | Wt 105.0 lb

## 2021-10-29 DIAGNOSIS — R1011 Right upper quadrant pain: Secondary | ICD-10-CM | POA: Diagnosis not present

## 2021-10-29 DIAGNOSIS — R110 Nausea: Secondary | ICD-10-CM

## 2021-10-29 DIAGNOSIS — N3000 Acute cystitis without hematuria: Secondary | ICD-10-CM | POA: Diagnosis not present

## 2021-10-29 DIAGNOSIS — R5383 Other fatigue: Secondary | ICD-10-CM

## 2021-10-29 DIAGNOSIS — N3281 Overactive bladder: Secondary | ICD-10-CM

## 2021-10-29 DIAGNOSIS — R350 Frequency of micturition: Secondary | ICD-10-CM | POA: Diagnosis not present

## 2021-10-29 LAB — POCT URINALYSIS DIPSTICK
Bilirubin, UA: NEGATIVE
Glucose, UA: NEGATIVE
Ketones, UA: NEGATIVE
Nitrite, UA: NEGATIVE
Protein, UA: NEGATIVE
Spec Grav, UA: 1.01 (ref 1.010–1.025)
Urobilinogen, UA: 0.2 E.U./dL
pH, UA: 6 (ref 5.0–8.0)

## 2021-10-29 MED ORDER — CEPHALEXIN 500 MG PO CAPS: 500.0000 mg | ORAL_CAPSULE | Freq: Three times a day (TID) | ORAL | 0 refills | Status: DC

## 2021-10-29 NOTE — Progress Notes (Signed)
Date:  10/29/2021   Name:  Karen Kelly   DOB:  17-Oct-1965   MRN:  [id number]   Chief Complaint: Abdominal Pain (Started on Thursday, got worse on Friday. Has been nauseated, fatigued and having pain in the R) flank and across lower back. "Tummy feels tender")  Abdominal Pain This is a new problem. The current episode started yesterday. The problem occurs daily. The problem has been gradually worsening. The pain is located in the suprapubic region. The pain is at a severity of 4/10. The pain is mild. Associated symptoms include a fever, frequency, melena and nausea. Pertinent negatives include no constipation, diarrhea, hematochezia or hematuria.  Flank Pain This is a new problem. The current episode started in the past 7 days. The problem occurs constantly. The problem has been waxing and waning since onset. The quality of the pain is described as aching. Radiates to: back. The pain is mild. Associated symptoms include abdominal pain, a fever and pelvic pain. Pertinent negatives include no chest pain.  Thyroid Problem Presents for follow-up (for fatigue) visit. Symptoms include fatigue. Patient reports no constipation or diarrhea. The symptoms have been stable.   No results found for: NA, K, CO2, GLUCOSE, BUN, CREATININE, CALCIUM, EGFR, GFRNONAA, GLUCOSE No results found for: CHOL, HDL, LDLCALC, LDLDIRECT, TRIG, CHOLHDL No results found for: TSH No results found for: HGBA1C No results found for: WBC, HGB, HCT, MCV, PLT No results found for: ALT, AST, GGT, ALKPHOS, BILITOT No results found for: 25OHVITD2, 25OHVITD3, VD25OH   Review of Systems  Constitutional:  Positive for chills, fatigue and fever.  Respiratory:  Negative for shortness of breath.   Cardiovascular:  Negative for chest pain.  Gastrointestinal:  Positive for abdominal pain, melena and nausea. Negative for constipation, diarrhea and hematochezia.  Genitourinary:  Positive for flank pain, frequency and pelvic pain.  Negative for hematuria.   Patient Active Problem List   Diagnosis Date Noted   Encounter for screening colonoscopy    Benign neoplasm of cecum    At high risk for breast cancer 06/26/2018    No Known Allergies  Past Surgical History:  Procedure Laterality Date   COLONOSCOPY WITH PROPOFOL N/A 01/27/2020   Procedure: COLONOSCOPY WITH BIOPSY;  Surgeon: Lucilla Lame, MD;  Location: [facility];  Service: Endoscopy;  Laterality: N/A;  priority 4   FOOT SURGERY     POLYPECTOMY N/A 01/27/2020   Procedure: POLYPECTOMY;  Surgeon: Lucilla Lame, MD;  Location: [facility];  Service: Endoscopy;  Laterality: N/A;    Social History   Tobacco Use   Smoking status: Never   Smokeless tobacco: Never  Substance Use Topics   Alcohol use: Yes    Alcohol/week: 6.0 standard drinks    Types: 6 Glasses of wine per week   Drug use: No     Medication list has been reviewed and updated.  No outpatient medications have been marked as taking for the 10/29/21 encounter (Office Visit) with Juline Patch, MD.       10/29/2021   10:51 AM 12/06/2019    1:40 PM  GAD 7 : Generalized Anxiety Score  Nervous, Anxious, on Edge 1 0  Control/stop worrying 0 0  Worry too much - different things 1 0  Trouble relaxing 0 0  Restless 0 0  Easily annoyed or irritable 1 0  Afraid - awful might happen 0 0  Total GAD 7 Score 3 0  Anxiety Difficulty Not difficult at all  10/29/2021   10:50 AM  Depression screen PHQ 2/9  Decreased Interest 0  Down, Depressed, Hopeless 1  PHQ - 2 Score 1  Altered sleeping 2  Tired, decreased energy 3  Change in appetite 1  Feeling bad or failure about yourself  0  Trouble concentrating 1  Moving slowly or fidgety/restless 0  Suicidal thoughts 0  PHQ-9 Score 8  Difficult doing work/chores Somewhat difficult    BP Readings from Last 3 Encounters:  10/29/21 100/60  08/14/21 100/60  01/27/20 (!) 83/52    Physical Exam Vitals and nursing note  reviewed. Exam conducted with a chaperone present.  Constitutional:      General: She is not in acute distress.    Appearance: She is not diaphoretic.  HENT:     Head: Normocephalic and atraumatic.     Right Ear: External ear normal.     Left Ear: External ear normal.     Nose: Nose normal.  Eyes:     General:        Right eye: No discharge.        Left eye: No discharge.     Conjunctiva/sclera: Conjunctivae normal.     Pupils: Pupils are equal, round, and reactive to light.  Neck:     Thyroid: No thyromegaly.     Vascular: No JVD.  Cardiovascular:     Rate and Rhythm: Normal rate and regular rhythm.     Heart sounds: Normal heart sounds. No murmur heard.   No friction rub. No gallop.  Pulmonary:     Effort: Pulmonary effort is normal.     Breath sounds: Normal breath sounds.  Abdominal:     General: Abdomen is flat. Bowel sounds are normal.     Palpations: Abdomen is soft. There is no hepatomegaly, splenomegaly or mass.     Tenderness: There is abdominal tenderness in the right upper quadrant. There is no guarding or rebound.  Musculoskeletal:        General: Normal range of motion.     Cervical back: Normal range of motion and neck supple.  Lymphadenopathy:     Cervical: No cervical adenopathy.  Skin:    General: Skin is warm and dry.  Neurological:     Mental Status: She is alert.     Deep Tendon Reflexes: Reflexes are normal and symmetric.    Wt Readings from Last 3 Encounters:  10/29/21 105 lb (47.6 kg)  08/14/21 109 lb (49.4 kg)  01/27/20 103 lb (46.7 kg)    BP 100/60   Pulse 76   Ht 5' 3"  (1.6 m)   Wt 105 lb (47.6 kg)   BMI 18.60 kg/m   Assessment and Plan:  1. Right upper quadrant abdominal pain New onset.  Persistent.  Relatively stable and that it is tolerable but but present.  Will check TSH renal function panel lipase and CBC as well as hepatic panel. - TSH - Renal Function Panel - Lipase  2. Urinary frequency Urinary frequency and urinalysis  was done which notes leukocytes with small amount of RBCs.  Looks treat with cephalexin 500 mg 3 times a day for 4 days and will obtain urine culture. - POCT urinalysis dipstick - Urine Culture  3. Overactive bladder Chronic.  Persistent.  Stable.  Patient continues to have overactive bladder but is unable to tolerate medication.  We will refer to urology for further evaluation. - Ambulatory referral to Urology  4. Nausea Nausea noted but is not having and further  intervention.  5. Acute cystitis without hematuria Acute.  Persistent.  Stable.  Tenderness over the suprapubic area is consistent with also a UTI and we will treat with cephalexin 500 mg 3 times a day. - POCT urinalysis dipstick - Urine Culture - cephALEXin (KEFLEX) 500 MG capsule; Take 1 capsule (500 mg total) by mouth 3 (three) times daily.  Dispense: 12 capsule; Refill: 0  6. Fatigue, unspecified type New onset.  Persistent.  Patient has had extensive fatigue.  We will initiate evaluation with CBC/TSH/renal function and hepatic function. - CBC - TSH - Renal Function Panel - Hepatic Function Panel (6)

## 2021-10-30 ENCOUNTER — Encounter: Payer: Self-pay | Admitting: Family Medicine

## 2021-10-30 LAB — CBC
Hematocrit: 37.3 % (ref 34.0–46.6)
Hemoglobin: 12.6 g/dL (ref 11.1–15.9)
MCH: 30.7 pg (ref 26.6–33.0)
MCHC: 33.8 g/dL (ref 31.5–35.7)
MCV: 91 fL (ref 79–97)
Platelets: 325 10*3/uL (ref 150–450)
RBC: 4.11 x10E6/uL (ref 3.77–5.28)
RDW: 12.7 % (ref 11.7–15.4)
WBC: 11.6 10*3/uL — ABNORMAL HIGH (ref 3.4–10.8)

## 2021-10-30 LAB — RENAL FUNCTION PANEL
Albumin: 4.5 g/dL (ref 3.8–4.9)
BUN/Creatinine Ratio: 18 (ref 9–23)
BUN: 17 mg/dL (ref 6–24)
CO2: 24 mmol/L (ref 20–29)
Calcium: 9.6 mg/dL (ref 8.7–10.2)
Chloride: 98 mmol/L (ref 96–106)
Creatinine, Ser: 0.95 mg/dL (ref 0.57–1.00)
Glucose: 94 mg/dL (ref 70–99)
Phosphorus: 2.7 mg/dL — ABNORMAL LOW (ref 3.0–4.3)
Potassium: 4.1 mmol/L (ref 3.5–5.2)
Sodium: 138 mmol/L (ref 134–144)
eGFR: 70 mL/min/{1.73_m2} (ref >59)

## 2021-10-30 LAB — HEPATIC FUNCTION PANEL (6)
ALT: 16 IU/L (ref 0–32)
AST: 21 IU/L (ref 0–40)
Alkaline Phosphatase: 77 IU/L (ref 44–121)
Bilirubin Total: 0.5 mg/dL (ref 0.0–1.2)
Bilirubin, Direct: 0.16 mg/dL (ref 0.00–0.40)

## 2021-10-30 LAB — LIPASE: Lipase: 31 U/L (ref 14–72)

## 2021-10-30 LAB — TSH: TSH: 2.45 u[IU]/mL (ref 0.450–4.500)

## 2021-11-02 LAB — URINE CULTURE

## 2021-11-06 ENCOUNTER — Ambulatory Visit (INDEPENDENT_AMBULATORY_CARE_PROVIDER_SITE_OTHER): Payer: BC Managed Care – PPO | Admitting: Family Medicine

## 2021-11-06 ENCOUNTER — Encounter: Payer: Self-pay | Admitting: Family Medicine

## 2021-11-06 VITALS — BP 120/60 | HR 60 | Ht 63.0 in | Wt 105.0 lb

## 2021-11-06 DIAGNOSIS — R350 Frequency of micturition: Secondary | ICD-10-CM

## 2021-11-06 DIAGNOSIS — K760 Fatty (change of) liver, not elsewhere classified: Secondary | ICD-10-CM | POA: Diagnosis not present

## 2021-11-06 DIAGNOSIS — R1011 Right upper quadrant pain: Secondary | ICD-10-CM

## 2021-11-06 DIAGNOSIS — N12 Tubulo-interstitial nephritis, not specified as acute or chronic: Secondary | ICD-10-CM | POA: Diagnosis not present

## 2021-11-06 LAB — POCT URINALYSIS DIPSTICK
Bilirubin, UA: NEGATIVE
Blood, UA: NEGATIVE
Glucose, UA: NEGATIVE
Ketones, UA: NEGATIVE
Leukocytes, UA: NEGATIVE
Nitrite, UA: NEGATIVE
Protein, UA: NEGATIVE
Spec Grav, UA: 1.01 (ref 1.010–1.025)
Urobilinogen, UA: 0.2 E.U./dL
pH, UA: 5 (ref 5.0–8.0)

## 2021-11-06 NOTE — Progress Notes (Signed)
Date:  11/06/2021   Name:  Karen Kelly   DOB:  05/30/1966   MRN:  [id number]   Chief Complaint: Follow-up (Urine infection) and fatty liver (Needs chol check)  Abdominal Pain This is a new problem. The problem occurs intermittently. The problem has been gradually improving. The pain is located in the right flank, epigastric region and RUQ. The quality of the pain is colicky. The abdominal pain radiates to the back. Pertinent negatives include no constipation, diarrhea, dysuria, fever, frequency, hematochezia, hematuria or melena. Nothing aggravates the pain.   Lab Results  Component Value Date   NA 138 10/29/2021   K 4.1 10/29/2021   CO2 24 10/29/2021   GLUCOSE 94 10/29/2021   BUN 17 10/29/2021   CREATININE 0.95 10/29/2021   CALCIUM 9.6 10/29/2021   EGFR 70 10/29/2021   No results found for: CHOL, HDL, LDLCALC, LDLDIRECT, TRIG, CHOLHDL Lab Results  Component Value Date   TSH 2.450 10/29/2021   No results found for: HGBA1C Lab Results  Component Value Date   WBC 11.6 (H) 10/29/2021   HGB 12.6 10/29/2021   HCT 37.3 10/29/2021   MCV 91 10/29/2021   PLT 325 10/29/2021   Lab Results  Component Value Date   ALT 16 10/29/2021   AST 21 10/29/2021   ALKPHOS 77 10/29/2021   BILITOT 0.5 10/29/2021   No results found for: 25OHVITD2, 25OHVITD3, VD25OH   Review of Systems  Constitutional:  Negative for fever.  Cardiovascular:  Negative for chest pain.  Gastrointestinal:  Positive for abdominal pain. Negative for blood in stool, constipation, diarrhea, hematochezia and melena.  Genitourinary:  Negative for dysuria, frequency, hematuria, pelvic pain, urgency and vaginal bleeding.   Patient Active Problem List   Diagnosis Date Noted   Encounter for screening colonoscopy    Benign neoplasm of cecum    At high risk for breast cancer 06/26/2018    No Known Allergies  Past Surgical History:  Procedure Laterality Date   COLONOSCOPY WITH PROPOFOL N/A 01/27/2020    Procedure: COLONOSCOPY WITH BIOPSY;  Surgeon: Lucilla Lame, MD;  Location: [facility];  Service: Endoscopy;  Laterality: N/A;  priority 4   FOOT SURGERY     POLYPECTOMY N/A 01/27/2020   Procedure: POLYPECTOMY;  Surgeon: Lucilla Lame, MD;  Location: [facility];  Service: Endoscopy;  Laterality: N/A;    Social History   Tobacco Use   Smoking status: Never   Smokeless tobacco: Never  Substance Use Topics   Alcohol use: Yes    Alcohol/week: 6.0 standard drinks    Types: 6 Glasses of wine per week   Drug use: No     Medication list has been reviewed and updated.  Current Meds  Medication Sig   cephALEXin (KEFLEX) 500 MG capsule Take 1 capsule (500 mg total) by mouth 3 (three) times daily.       11/06/2021    9:54 AM 10/29/2021   10:51 AM 12/06/2019    1:40 PM  GAD 7 : Generalized Anxiety Score  Nervous, Anxious, on Edge 0 1 0  Control/stop worrying 0 0 0  Worry too much - different things 0 1 0  Trouble relaxing 0 0 0  Restless 0 0 0  Easily annoyed or irritable 0 1 0  Afraid - awful might happen 0 0 0  Total GAD 7 Score 0 3 0  Anxiety Difficulty Not difficult at all Not difficult at all        11/06/2021  9:53 AM  Depression screen PHQ 2/9  Decreased Interest 1  Down, Depressed, Hopeless 0  PHQ - 2 Score 1  Altered sleeping 1  Tired, decreased energy 1  Change in appetite 1  Feeling bad or failure about yourself  0  Trouble concentrating 0  Moving slowly or fidgety/restless 0  Suicidal thoughts 0  PHQ-9 Score 4  Difficult doing work/chores Not difficult at all    BP Readings from Last 3 Encounters:  11/06/21 120/60  10/29/21 100/60  08/14/21 100/60    Physical Exam  Wt Readings from Last 3 Encounters:  11/06/21 105 lb (47.6 kg)  10/29/21 105 lb (47.6 kg)  08/14/21 109 lb (49.4 kg)    BP 120/60   Pulse 60   Ht _0  (1.6 m)   Wt 105 lb (47.6 kg)   BMI 18.60 kg/m   Assessment and Plan:  1. Urinary frequency Patient is  followed up for urinary frequency.  She is currently on cephalexin for pyelonephritis and symptoms are improving.  Urinalysis was done today and it is normal at this time - POCT urinalysis dipstick  2. Right upper quadrant abdominal pain Patient has had right upper quadrant pain that is episodic in nature and CT noted that there was thickening of the gallbladder and there is some concerned about the possibility of early cholecystitis. - Ambulatory referral to General Surgery  3. Pyelonephritis New onset.  Improving.  Patient has had resolution of CVA tenderness and flank pain on antibiotic and I have encouraged her to take it to its completion.  4. Steatosis of liver As part of the evaluation it was noted that the liver has some steatosis and some nonspecific changes in the liver.  We will check lipid panel for current status and address accordingly. - Lipid Panel With LDL/HDL Ratio

## 2021-11-07 LAB — LIPID PANEL WITH LDL/HDL RATIO
Cholesterol, Total: 167 mg/dL (ref 100–199)
HDL: 50 mg/dL (ref >39)
LDL Chol Calc (NIH): 104 mg/dL — ABNORMAL HIGH (ref 0–99)
LDL/HDL Ratio: 2.1 ratio (ref 0.0–3.2)
Triglycerides: 68 mg/dL (ref 0–149)
VLDL Cholesterol Cal: 13 mg/dL (ref 5–40)

## 2021-11-09 ENCOUNTER — Other Ambulatory Visit: Payer: Self-pay | Admitting: Family Medicine

## 2021-11-19 ENCOUNTER — Encounter: Payer: Self-pay | Admitting: Surgery

## 2021-11-19 ENCOUNTER — Ambulatory Visit: Payer: BC Managed Care – PPO | Admitting: Surgery

## 2021-11-19 VITALS — BP 123/77 | HR 55 | Temp 98.1°F | Ht 63.5 in | Wt 104.2 lb

## 2021-11-19 DIAGNOSIS — K769 Liver disease, unspecified: Secondary | ICD-10-CM

## 2021-11-19 DIAGNOSIS — K760 Fatty (change of) liver, not elsewhere classified: Secondary | ICD-10-CM

## 2021-11-19 DIAGNOSIS — K828 Other specified diseases of gallbladder: Secondary | ICD-10-CM

## 2021-11-19 NOTE — Patient Instructions (Addendum)
Your Ultrasound is scheduled for 11/21/2021 at 8 am (arrive by 7:30 am) [facility] in [place]. Nothing to eat or drink after midnight. We will call you with the results.    A referral has been placed with [facility] in [place]. They will call you with an appointment.    If you have any concerns or questions, please feel free to call our office.

## 2021-11-19 NOTE — Progress Notes (Signed)
11/19/2021  Reason for Visit: Gallbladder wall thickening, liver lesions.  Requesting Provider:  Otilio Miu, MD  History of Present Illness: Karen Kelly is a 56 y.o. female presenting for evaluation of gallbladder wall thickening and liver lesions noted on recent CT scan.  Patient presented to [facility] emergency department on 10/30/2021 with bilateral flank pain and suprapubic pain.  She had seen Dr. Ronnald Ramp the day before the patient to the emergency room due to worsening pain.  In the emergency room, she had a CT scan which showed right-sided pyelonephritis with also some mild thickening of the gallbladder without any pericholecystic fluid or gallstones noted on CT.  Unfortunately I am not able to see the images from [facility] but can only read the report on care everywhere.  On her labs, her LFTs were normal and her white blood cell count was normal.  She was treated for UTI and pyelonephritis and was given an antibiotic course.  She reports that she has completed this and currently feels much better without any symptoms.  However she is worried about the findings regarding her liver and gallbladder on her CT scan.  She does report that her father died of liver cancer and he did not have any history of hepatitis C or alcohol cirrhosis.  From the gallbladder standpoint, the patient denies having any issues with right upper quadrant or epigastric abdominal pain, nausea, or vomiting.  Past Medical History: Past Medical History:  Diagnosis Date   Family history of adverse reaction to anesthesia    hard time waking up (mom)   GERD (gastroesophageal reflux disease)      Past Surgical History: Past Surgical History:  Procedure Laterality Date   COLONOSCOPY WITH PROPOFOL N/A 01/27/2020   Procedure: COLONOSCOPY WITH BIOPSY;  Surgeon: Lucilla Lame, MD;  Location: [place];  Service: Endoscopy;  Laterality: N/A;  priority 4   FOOT SURGERY     POLYPECTOMY N/A 01/27/2020   Procedure: POLYPECTOMY;   Surgeon: Lucilla Lame, MD;  Location: [place];  Service: Endoscopy;  Laterality: N/A;    Home Medications: Prior to Admission medications   Not on File    Allergies: No Known Allergies  Social History:  reports that she has never smoked. She has never used smokeless tobacco. She reports current alcohol use of about 6.0 standard drinks of alcohol per week. She reports that she does not use drugs.   Family History: Patient reports that her father died of liver cancer.  Review of Systems: Review of Systems  Constitutional:  Negative for chills and fever.  HENT:  Negative for hearing loss.   Respiratory:  Negative for shortness of breath.   Cardiovascular:  Negative for chest pain.  Gastrointestinal:  Negative for abdominal pain, nausea and vomiting.  Genitourinary:  Negative for dysuria.  Musculoskeletal:  Negative for myalgias.  Skin:  Negative for rash.  Neurological:  Negative for dizziness.  Psychiatric/Behavioral:  Negative for depression.     Physical Exam BP 123/77   Pulse (!) 55   Temp 98.1 F (36.7 C) (Oral)   Ht 5' 3.5" (1.613 m)   Wt 104 lb 3.2 oz (47.3 kg)   SpO2 100%   BMI 18.17 kg/m  CONSTITUTIONAL: No acute distress, well-nourished HEENT:  Normocephalic, atraumatic, extraocular motion intact. NECK: Trachea is midline, and there is no jugular venous distension.  RESPIRATORY:  Lungs are clear, and breath sounds are equal bilaterally. Normal respiratory effort without pathologic use of accessory muscles. CARDIOVASCULAR: Heart is regular without  murmurs, gallops, or rubs. GI: The abdomen is soft, nondistended, nontender to palpation.  MUSCULOSKELETAL:  Normal muscle strength and tone in all four extremities.  No peripheral edema or cyanosis. SKIN: Skin turgor is normal. There are no pathologic skin lesions.  NEUROLOGIC:  Motor and sensation is grossly normal.  Cranial nerves are grossly intact. PSYCH:  Alert and oriented to person, place and time.  Affect is normal.  Laboratory Analysis: Labs from 10/30/2021: Sodium 141, potassium 3.8, chloride 105, CO2 25.5, BUN 16, creatinine 0.87.  Total bilirubin 0.6, AST 24, ALT 14, alkaline phosphatase 67, lipase 42.  WBC 10.2, hemoglobin 12.7, hematocrit 37.5, platelets 371.  Imaging: CT abdomen/pelvis 10/30/21: FINDINGS:   LOWER CHEST: Lung bases are clear. No pleural or pericardial effusion.   LIVER: Diffuse hepatic steatosis. Smooth hepatic contour. There are multiple subcentimeter hypoattenuating lesions throughout the hepatic parenchyma, too small to characterize.     BILIARY: Gallbladder is physiologically distended with mild circumferential wall thickening and mucosal enhancement measuring up to 4 mm (2:60). No radiopaque gallstones. No biliary ductal dilatation.     SPLEEN: Normal in size and contour.   PANCREAS: Normal pancreatic contour.  No focal lesions.  No ductal dilation.   ADRENAL GLANDS: Normal appearance of the adrenal glands.   KIDNEYS/URETERS: Symmetric renal enhancement.  No hydronephrosis. Bilateral extrarenal pelvis. No solid renal mass.   BLADDER: Decompressed, limiting evaluation.   REPRODUCTIVE ORGANS: Unremarkable.   GI TRACT: No focal gastric wall thickening. Duodenum is normal in course and caliber. No evidence of bowel obstruction. No focal colonic wall thickening. Moderate colonic stool burden.   PERITONEUM, RETROPERITONEUM AND MESENTERY: No free air.  Small volume pelvic free fluid, likely physiologic. No fluid collection.   LYMPH NODES: No adenopathy.   VESSELS: Hepatic and portal veins are patent.  Normal caliber aorta.     BONES and SOFT TISSUES: No aggressive osseous lesions. Mild degenerative changes of the spine with vacuum disc phenomenon at L4-5 and L5-S1. No focal soft tissue lesions.   IMPRESSION:  --Striated nephrogram of the right kidney, compatible with right-sided pyelonephritis. Correlation with urinalysis recommended.   --Mild  circumferential wall thickening involving the gallbladder without evidence of adjacent soft tissue edema. This finding is nonspecific although can be seen in the setting of early cholecystitis. Further evaluation could be considered with dedicated right upper quadrant ultrasound if there is clinical concern for acute cholecystitis.  Assessment and Plan: This is a 56 y.o. female with gallbladder wall thickening and multiple subcentimeter liver lesions.  - Discussed with the patient that it is uncertain why on CT scan the gallbladder wall had mild wall thickening.  Without being able to see the images, it is more difficult to say but potentially some inflammation from the right-sided pyelonephritis could have resulted in this finding on her gallbladder.  There are no gallstones that were noted on CT scan but discussed with the patient that ultrasound is a better study to evaluate for cholelithiasis.  The liver lesions that she had are so small there is difficult to characterize them.  However obtaining an ultrasound may be able to give [place] some more information on these while looking at her gallbladder at the same time.  Her CT scan also showed diffuse hepatic steatosis which will be able to evaluate further with the ultrasound.  Given her concern about her family history, I think is a reasonable step to start with this. - Her LFTs have been normal and there is no evidence of any  fibrosis or cirrhosis on her CT scan.  However as a precaution, we will also send a referral to gastroenterology for further evaluation of her hepatic steatosis.  Discussed with her that if the ultrasound does not show any more detail or specific information on her liver lesions, she could potentially need an MRI but I will defer to GI on further evaluation of this.  If the ultrasound does not show any cholelithiasis, then most likely the wall thickening could be related to surrounding reactive inflammatory changes and no further imaging  studies will need to be done for the gallbladder standpoint particularly since she has otherwise been asymptomatic without any biliary colic. -I will contact the patient with the results of her ultrasound.  I spent 40 minutes dedicated to the care of this patient on the date of this encounter to include pre-visit review of records, face-to-face time with the patient discussing diagnosis and management, and any post-visit coordination of care.   Melvyn Neth, [facility]

## 2021-11-21 ENCOUNTER — Other Ambulatory Visit: Payer: BC Managed Care – PPO

## 2021-11-27 ENCOUNTER — Ambulatory Visit
Admission: RE | Admit: 2021-11-27 | Discharge: 2021-11-27 | Disposition: A | Discharge status: Home / Self Care | Payer: BC Managed Care – PPO | Source: Ambulatory Visit | Attending: Surgery | Admitting: Surgery

## 2021-11-27 DIAGNOSIS — K828 Other specified diseases of gallbladder: Secondary | ICD-10-CM | POA: Insufficient documentation

## 2021-11-27 DIAGNOSIS — K769 Liver disease, unspecified: Secondary | ICD-10-CM | POA: Diagnosis not present

## 2021-11-28 NOTE — Progress Notes (Signed)
11/28/21  Called patient to discuss results of her U/S.  Gallbladder has multiple folds which I think is what the CT scan is seeing as thickened gallbladder wall, but on U/S, the wall is only 2 mm thick.  No gallstones or polyps.  There are two noted liver lesions that may represent hemangiomas.  The CT scan report mentions multiple lesions, so unclear if these are too small to see on U/S or if there were really only two.  Follow up U/S is recommended in 6 months.  Patient has appointment with Dr. Allen Norris on 03/20/22 and will defer to him whether U/S vs other imaging is needed.  Patient can follow up with me as needed.  Karen Ree, MD

## 2021-11-29 ENCOUNTER — Ambulatory Visit (INDEPENDENT_AMBULATORY_CARE_PROVIDER_SITE_OTHER): Payer: BC Managed Care – PPO | Admitting: Family Medicine

## 2021-11-29 ENCOUNTER — Encounter: Payer: Self-pay | Admitting: Family Medicine

## 2021-11-29 VITALS — BP 120/80 | HR 60 | Ht 63.5 in | Wt 101.0 lb

## 2021-11-29 DIAGNOSIS — R350 Frequency of micturition: Secondary | ICD-10-CM

## 2021-11-29 LAB — POCT URINALYSIS DIPSTICK
Bilirubin, UA: NEGATIVE
Blood, UA: NEGATIVE
Glucose, UA: NEGATIVE
Ketones, UA: NEGATIVE
Leukocytes, UA: NEGATIVE
Nitrite, UA: NEGATIVE
Protein, UA: NEGATIVE
Spec Grav, UA: 1.02 (ref 1.010–1.025)
Urobilinogen, UA: 0.2 E.U./dL
pH, UA: 5 (ref 5.0–8.0)

## 2021-11-29 NOTE — Progress Notes (Signed)
Date:  11/29/2021   Name:  Karen Kelly   DOB:  07/29/1965   MRN:  [id number]   Chief Complaint: urine frequency  Urinary Frequency  This is a recurrent problem. Episode onset: this episode/started monday. The problem has been waxing and waning. She is Sexually active. Associated symptoms include frequency and urgency. Pertinent negatives include no chills, discharge, hematuria, hesitancy, nausea or vomiting.    Lab Results  Component Value Date   NA 138 10/29/2021   K 4.1 10/29/2021   CO2 24 10/29/2021   GLUCOSE 94 10/29/2021   BUN 17 10/29/2021   CREATININE 0.95 10/29/2021   CALCIUM 9.6 10/29/2021   EGFR 70 10/29/2021   Lab Results  Component Value Date   CHOL 167 11/06/2021   HDL 50 11/06/2021   LDLCALC 104 (H) 11/06/2021   TRIG 68 11/06/2021   Lab Results  Component Value Date   TSH 2.450 10/29/2021   No results found for: "HGBA1C" Lab Results  Component Value Date   WBC 11.6 (H) 10/29/2021   HGB 12.6 10/29/2021   HCT 37.3 10/29/2021   MCV 91 10/29/2021   PLT 325 10/29/2021   Lab Results  Component Value Date   ALT 16 10/29/2021   AST 21 10/29/2021   ALKPHOS 77 10/29/2021   BILITOT 0.5 10/29/2021   No results found for: "25OHVITD2", "25OHVITD3", "VD25OH"   Review of Systems  Constitutional:  Negative for chills and fever.  HENT:  Negative for drooling, ear discharge, ear pain and sore throat.   Respiratory:  Negative for cough, shortness of breath and wheezing.   Cardiovascular:  Negative for chest pain, palpitations and leg swelling.  Gastrointestinal:  Negative for abdominal pain, blood in stool, constipation, diarrhea, nausea and vomiting.  Endocrine: Negative for polydipsia.  Genitourinary:  Positive for frequency and urgency. Negative for dysuria, hematuria and hesitancy.  Musculoskeletal:  Negative for back pain, myalgias and neck pain.  Skin:  Negative for rash.  Allergic/Immunologic: Negative for environmental allergies.  Neurological:   Negative for dizziness and headaches.  Hematological:  Does not bruise/bleed easily.  Psychiatric/Behavioral:  Negative for suicidal ideas. The patient is not nervous/anxious.     Patient Active Problem List   Diagnosis Date Noted   Encounter for screening colonoscopy    Benign neoplasm of cecum    At high risk for breast cancer 06/26/2018    No Known Allergies  Past Surgical History:  Procedure Laterality Date   COLONOSCOPY WITH PROPOFOL N/A 01/27/2020   Procedure: COLONOSCOPY WITH BIOPSY;  Surgeon: Lucilla Lame, MD;  Location: [place];  Service: Endoscopy;  Laterality: N/A;  priority 4   FOOT SURGERY     POLYPECTOMY N/A 01/27/2020   Procedure: POLYPECTOMY;  Surgeon: Lucilla Lame, MD;  Location: [place];  Service: Endoscopy;  Laterality: N/A;    Social History   Tobacco Use   Smoking status: Never   Smokeless tobacco: Never  Substance Use Topics   Alcohol use: Yes    Alcohol/week: 6.0 standard drinks of alcohol    Types: 6 Glasses of wine per week   Drug use: No     Medication list has been reviewed and updated.  No outpatient medications have been marked as taking for the 11/29/21 encounter (Office Visit) with Juline Patch, MD.       11/29/2021   11:00 AM 11/06/2021    9:54 AM 10/29/2021   10:51 AM 12/06/2019    1:40 PM  GAD 7 :  Generalized Anxiety Score  Nervous, Anxious, on Edge 0 0 1 0  Control/stop worrying 0 0 0 0  Worry too much - different things 0 0 1 0  Trouble relaxing 0 0 0 0  Restless 0 0 0 0  Easily annoyed or irritable 0 0 1 0  Afraid - awful might happen 0 0 0 0  Total GAD 7 Score 0 0 3 0  Anxiety Difficulty Not difficult at all Not difficult at all Not difficult at all        11/29/2021   11:00 AM  Depression screen PHQ 2/9  Decreased Interest 0  Down, Depressed, Hopeless 0  PHQ - 2 Score 0  Altered sleeping 0  Tired, decreased energy 0  Change in appetite 0  Feeling bad or failure about yourself  0  Trouble  concentrating 0  Moving slowly or fidgety/restless 0  Suicidal thoughts 0  PHQ-9 Score 0  Difficult doing work/chores Not difficult at all    BP Readings from Last 3 Encounters:  11/29/21 120/80  11/19/21 123/77  11/06/21 120/60    Physical Exam HENT:     Right Ear: Tympanic membrane normal.     Left Ear: Tympanic membrane normal.     Nose: Nose normal.  Cardiovascular:     Heart sounds: No murmur heard. Pulmonary:     Effort: No respiratory distress.     Breath sounds: No stridor. No wheezing or rhonchi.  Abdominal:     Tenderness: There is abdominal tenderness in the suprapubic area. There is no right CVA tenderness or left CVA tenderness.     Wt Readings from Last 3 Encounters:  11/29/21 101 lb (45.8 kg)  11/19/21 104 lb 3.2 oz (47.3 kg)  11/06/21 105 lb (47.6 kg)    BP 120/80   Pulse 60   Ht 5' 3.5" (1.613 m)   Wt 101 lb (45.8 kg)   BMI 17.61 kg/m   Assessment and Plan:  1. Urinary frequency Recent reoccurrence.  Persistent.  Patient has not been on her Ditropan at this time.  Patient has developed this symptom after she held her urine for a period of time.  Patient primarily has frequency and on evaluation of urinalysis is unremarkable for infection.  There is no CVA tenderness to suggest a recurrence of her pyelonephritis.  This is suggesting to be more of a overactive bladder perhaps remotely a cystitis (on infectious nature.  Patient will resume her discharge hand because it only caused occasional headache and if she can break it in half she will take 2.5 mg rather than the 5.  In the meantime we have reestablish contact with urology and she will be returning for further evaluation of her continued symptomatology. - POCT urinalysis dipstick

## 2021-12-04 ENCOUNTER — Ambulatory Visit: Payer: BC Managed Care – PPO | Admitting: Urology

## 2021-12-04 ENCOUNTER — Other Ambulatory Visit: Payer: Self-pay | Admitting: *Deleted

## 2021-12-04 ENCOUNTER — Encounter: Payer: Self-pay | Admitting: Urology

## 2021-12-04 ENCOUNTER — Other Ambulatory Visit
Admission: RE | Admit: 2021-12-04 | Discharge: 2021-12-04 | Disposition: A | Discharge status: Home / Self Care | Payer: BC Managed Care – PPO | Attending: Urology | Admitting: Urology

## 2021-12-04 VITALS — BP 123/81 | HR 56 | Ht 63.0 in | Wt 101.0 lb

## 2021-12-04 DIAGNOSIS — N390 Urinary tract infection, site not specified: Secondary | ICD-10-CM

## 2021-12-04 DIAGNOSIS — Z87440 Personal history of urinary (tract) infections: Secondary | ICD-10-CM

## 2021-12-04 DIAGNOSIS — N3281 Overactive bladder: Secondary | ICD-10-CM | POA: Diagnosis present

## 2021-12-04 DIAGNOSIS — R3915 Urgency of urination: Secondary | ICD-10-CM | POA: Diagnosis not present

## 2021-12-04 DIAGNOSIS — R351 Nocturia: Secondary | ICD-10-CM

## 2021-12-04 DIAGNOSIS — R350 Frequency of micturition: Secondary | ICD-10-CM

## 2021-12-04 LAB — URINALYSIS, COMPLETE (UACMP) WITH MICROSCOPIC
Bilirubin Urine: NEGATIVE
Glucose, UA: NEGATIVE mg/dL
Hgb urine dipstick: NEGATIVE
Ketones, ur: NEGATIVE mg/dL
Leukocytes,Ua: NEGATIVE
Nitrite: NEGATIVE
Protein, ur: NEGATIVE mg/dL
Specific Gravity, Urine: <1.005 — ABNORMAL LOW (ref 1.005–1.030)
pH: 6.5 (ref 5.0–8.0)

## 2021-12-04 LAB — BLADDER SCAN AMB NON-IMAGING

## 2021-12-04 MED ORDER — MIRABEGRON ER 50 MG PO TB24: 50.0000 mg | ORAL_TABLET | Freq: Every day | ORAL | 0 refills | Status: DC

## 2021-12-07 ENCOUNTER — Other Ambulatory Visit: Payer: Self-pay

## 2021-12-07 NOTE — Telephone Encounter (Signed)
error 

## 2022-01-01 ENCOUNTER — Ambulatory Visit: Payer: BC Managed Care – PPO | Admitting: Urology

## 2022-01-07 ENCOUNTER — Ambulatory Visit: Payer: BC Managed Care – PPO | Admitting: Urology

## 2022-01-08 ENCOUNTER — Ambulatory Visit: Payer: BC Managed Care – PPO | Admitting: Urology

## 2022-01-08 ENCOUNTER — Encounter: Payer: Self-pay | Admitting: Urology

## 2022-01-08 VITALS — BP 114/75 | HR 62 | Ht 63.0 in | Wt 97.0 lb

## 2022-01-08 DIAGNOSIS — N3281 Overactive bladder: Secondary | ICD-10-CM | POA: Diagnosis not present

## 2022-01-08 NOTE — Progress Notes (Signed)
   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  She had some worsening urgency and frequency after recent UTI that was treated with antibiotics.  She also was trialed on oxybutynin by PCP at that time with no significant improvement and bothersome headache.  She drinks coffee in the morning but otherwise water.  Not having any significant stress or urge incontinence.  Urinalysis and PVR were benign.  She opted for a trial of Myrbetriq 50 mg daily, and samples were given at her last visit.  She reports significant improvement in her urinary symptoms on that medication.  She feels her overactive symptoms have improved significantly, and she is also made an effort to practice timed voiding and avoid bladder irritants.  She has been off the Myrbetriq for 1 week and feels like her symptoms are stable, and would like to stay off medication if possible.  We discussed that overactive bladder (OAB) is not a disease, but is a symptom complex that is generally not life-threatening.  Symptoms typically include urinary urgency, frequency, and urge incontinence.  There are numerous treatment options, however there are risks and benefits with both medical and surgical management.  First-line treatment is behavioral therapies including bladder training, pelvic floor muscle training, and fluid management.  Second line treatments include oral antimuscarinics(Ditropan er, Trospium) and beta-3 agonist (Mybetriq). There is typically a period of medication trial (4-8 weeks) to find the optimal therapy and dosing. If symptoms are bothersome despite the above management, third line options include intra-detrusor botox, peripheral tibial nerve stimulation (PTNS), and interstim (SNS). These are more invasive treatments with higher side effect profile, but may  improve quality of life for patients with severe OAB symptoms.   Behavioral strategies regarding OAB, follow-up as needed with urology, consider Myrbetriq again in the future as responded well to this medication  Billey Co, MD  [facility] [address] [place], [place] [address] [phone]

## 2022-01-08 NOTE — Patient Instructions (Signed)

## 2022-01-31 ENCOUNTER — Telehealth: Payer: Self-pay

## 2022-01-31 NOTE — Telephone Encounter (Signed)
Called to inform flu shots are in

## 2022-02-20 ENCOUNTER — Encounter: Payer: Self-pay | Admitting: Family Medicine

## 2022-02-20 ENCOUNTER — Ambulatory Visit (INDEPENDENT_AMBULATORY_CARE_PROVIDER_SITE_OTHER): Payer: BC Managed Care – PPO | Admitting: Family Medicine

## 2022-02-20 VITALS — BP 110/80 | HR 60 | Ht 63.0 in | Wt 96.0 lb

## 2022-02-20 DIAGNOSIS — N3000 Acute cystitis without hematuria: Secondary | ICD-10-CM

## 2022-02-20 DIAGNOSIS — R350 Frequency of micturition: Secondary | ICD-10-CM

## 2022-02-20 LAB — POCT URINALYSIS DIPSTICK
Bilirubin, UA: NEGATIVE
Blood, UA: NEGATIVE
Glucose, UA: NEGATIVE
Ketones, UA: NEGATIVE
Nitrite, UA: NEGATIVE
Protein, UA: NEGATIVE
Spec Grav, UA: 1.01 (ref 1.010–1.025)
Urobilinogen, UA: 0.2 E.U./dL
pH, UA: 6 (ref 5.0–8.0)

## 2022-02-20 MED ORDER — CEPHALEXIN 500 MG PO CAPS: 500.0000 mg | ORAL_CAPSULE | Freq: Three times a day (TID) | ORAL | 0 refills | Status: DC

## 2022-02-20 NOTE — Progress Notes (Signed)
Date:  02/20/2022   Name:  Karen Kelly   DOB:  08-24-1965   MRN:  [id number]   Chief Complaint: Urinary Tract Infection  Urinary Tract Infection  This is a new problem. The current episode started yesterday. The problem occurs every urination. The problem has been gradually worsening. The quality of the pain is described as burning. The patient is experiencing no pain (no suprapubic pain). There has been no fever. She is Sexually active. There is A history of pyelonephritis. Associated symptoms include frequency and urgency. Pertinent negatives include no flank pain or hematuria.    Lab Results  Component Value Date   NA 138 10/29/2021   K 4.1 10/29/2021   CO2 24 10/29/2021   GLUCOSE 94 10/29/2021   BUN 17 10/29/2021   CREATININE 0.95 10/29/2021   CALCIUM 9.6 10/29/2021   EGFR 70 10/29/2021   Lab Results  Component Value Date   CHOL 167 11/06/2021   HDL 50 11/06/2021   LDLCALC 104 (H) 11/06/2021   TRIG 68 11/06/2021   Lab Results  Component Value Date   TSH 2.450 10/29/2021   No results found for: "HGBA1C" Lab Results  Component Value Date   WBC 11.6 (H) 10/29/2021   HGB 12.6 10/29/2021   HCT 37.3 10/29/2021   MCV 91 10/29/2021   PLT 325 10/29/2021   Lab Results  Component Value Date   ALT 16 10/29/2021   AST 21 10/29/2021   ALKPHOS 77 10/29/2021   BILITOT 0.5 10/29/2021   No results found for: "25OHVITD2", "25OHVITD3", "VD25OH"   Review of Systems  Respiratory:  Negative for chest tightness and shortness of breath.   Cardiovascular:  Negative for chest pain and palpitations.  Genitourinary:  Positive for frequency and urgency. Negative for flank pain and hematuria.    Patient Active Problem List   Diagnosis Date Noted   Encounter for screening colonoscopy    Benign neoplasm of cecum    At high risk for breast cancer 06/26/2018    Allergies  Allergen Reactions   Codeine     Other reaction(s): Other (See Comments) Drowsy     Past Surgical  History:  Procedure Laterality Date   COLONOSCOPY WITH PROPOFOL N/A 01/27/2020   Procedure: COLONOSCOPY WITH BIOPSY;  Surgeon: Lucilla Lame, MD;  Location: [place];  Service: Endoscopy;  Laterality: N/A;  priority 4   FOOT SURGERY     POLYPECTOMY N/A 01/27/2020   Procedure: POLYPECTOMY;  Surgeon: Lucilla Lame, MD;  Location: [place];  Service: Endoscopy;  Laterality: N/A;    Social History   Tobacco Use   Smoking status: Never    Passive exposure: Never   Smokeless tobacco: Never  Substance Use Topics   Alcohol use: Yes    Alcohol/week: 6.0 standard drinks of alcohol    Types: 6 Glasses of wine per week   Drug use: No     Medication list has been reviewed and updated.  Current Meds  Medication Sig   Multiple Vitamin (MULTIVITAMIN) tablet Take 1 tablet by mouth daily.       02/20/2022   10:37 AM 11/29/2021   11:00 AM 11/06/2021    9:54 AM 10/29/2021   10:51 AM  GAD 7 : Generalized Anxiety Score  Nervous, Anxious, on Edge 0 0 0 1  Control/stop worrying 0 0 0 0  Worry too much - different things 0 0 0 1  Trouble relaxing 0 0 0 0  Restless 0 0 0 0  Easily annoyed or irritable 0 0 0 1  Afraid - awful might happen 0 0 0 0  Total GAD 7 Score 0 0 0 3  Anxiety Difficulty Not difficult at all Not difficult at all Not difficult at all Not difficult at all       02/20/2022   10:37 AM 11/29/2021   11:00 AM 11/06/2021    9:53 AM  Depression screen PHQ 2/9  Decreased Interest 0 0 1  Down, Depressed, Hopeless 0 0 0  PHQ - 2 Score 0 0 1  Altered sleeping 0 0 1  Tired, decreased energy 0 0 1  Change in appetite 0 0 1  Feeling bad or failure about yourself  0 0 0  Trouble concentrating 0 0 0  Moving slowly or fidgety/restless 0 0 0  Suicidal thoughts 0 0 0  PHQ-9 Score 0 0 4  Difficult doing work/chores Not difficult at all Not difficult at all Not difficult at all    BP Readings from Last 3 Encounters:  02/20/22 110/80  01/08/22 114/75  12/04/21  123/81    Physical Exam HENT:     Right Ear: Tympanic membrane normal.     Left Ear: Tympanic membrane normal.     Nose: Nose normal.     Mouth/Throat:     Mouth: Mucous membranes are moist.  Cardiovascular:     Heart sounds: No murmur heard.    No friction rub. No gallop.  Pulmonary:     Breath sounds: No wheezing or rhonchi.  Abdominal:     Tenderness: There is abdominal tenderness. There is no right CVA tenderness or left CVA tenderness.     Wt Readings from Last 3 Encounters:  02/20/22 96 lb (43.5 kg)  01/08/22 97 lb (44 kg)  12/04/21 101 lb (45.8 kg)    BP 110/80   Pulse 60   Ht 5' 3"  (1.6 m)   Wt 96 lb (43.5 kg)   BMI 17.01 kg/m   Assessment and Plan:  1. Urinary frequency Frequency with dysuria noted in the past 24 hours.  Dipstick notes leukocytes consistent with an acute cystitis. - POCT urinalysis dipstick  2. Acute cystitis without hematuria History and examination is consistent with an acute cystitis and will treat with cephalexin 500 mg 3 times a day for 3 days but up included x-rays to be taken 1 at the time of post checks in the morning after.  This was done in the past for suppression and we will see if this will work to reduce the number of UTI events. - cephALEXin (KEFLEX) 500 MG capsule; Take 1 capsule (500 mg total) by mouth 3 (three) times daily.  Dispense: 30 capsule; Refill: 0    Otilio Miu, MD

## 2022-02-21 ENCOUNTER — Ambulatory Visit: Payer: BC Managed Care – PPO | Admitting: Family Medicine

## 2022-03-20 ENCOUNTER — Ambulatory Visit (INDEPENDENT_AMBULATORY_CARE_PROVIDER_SITE_OTHER): Payer: BC Managed Care – PPO | Admitting: Gastroenterology

## 2022-03-20 ENCOUNTER — Encounter: Payer: Self-pay | Admitting: Gastroenterology

## 2022-03-20 VITALS — BP 111/72 | HR 61 | Temp 98.0°F | Ht 64.0 in | Wt 97.0 lb

## 2022-03-20 DIAGNOSIS — K769 Liver disease, unspecified: Secondary | ICD-10-CM | POA: Diagnosis not present

## 2022-03-20 NOTE — Progress Notes (Signed)
Primary Care Physician: Juline Patch, MD  Primary Gastroenterologist:  Dr. Lucilla Lame  Chief Complaint  Patient presents with   New Patient (Initial Visit)    HPI: Karen Kelly is a 56 y.o. female here after being seen by surgery for an abnormal CT scan of the abdomen.  Was recommended that the patient see me due to lesions found in the liver for further recommendations on imaging modality needed to follow up on these lesions.  The patient CT scan showed:  IMPRESSION: 1. Fold in the gallbladder giving the appearance of wall thickening, gallbladder wall thickness is normal. No gallstones. 2. Two hyperechoic liver lesions, larger measuring 1 cm. These have a typical appearance of hemangiomas. In the absence of known malignancy, recommend follow-up ultrasound in 6 months to establish stability. If there is a malignancy history, recommend further characterization with MRI. 3. Extrarenal pelvis configuration of the right kidney versus mild hydronephrosis, right proximal ureter is also prominent. This may be related to recent pyelonephritis.  The patient was concerned because her father had cancer in his liver that she reports to have been thought to be from the lungs.  The patient has not had any issues the present time.  She had a colonoscopy by me in the past and is not due for repeat colonoscopy at this time.  Past Medical History:  Diagnosis Date   Family history of adverse reaction to anesthesia    hard time waking up (mom)   GERD (gastroesophageal reflux disease)     Current Outpatient Medications  Medication Sig Dispense Refill   Multiple Vitamin (MULTIVITAMIN) tablet Take 1 tablet by mouth daily.     No current facility-administered medications for this visit.    Allergies as of 03/20/2022 - Review Complete 03/20/2022  Allergen Reaction Noted   Codeine  10/30/2021    ROS:  General: Negative for anorexia, weight loss, fever, chills, fatigue,  weakness. ENT: Negative for hoarseness, difficulty swallowing , nasal congestion. CV: Negative for chest pain, angina, palpitations, dyspnea on exertion, peripheral edema.  Respiratory: Negative for dyspnea at rest, dyspnea on exertion, cough, sputum, wheezing.  GI: See history of present illness. GU:  Negative for dysuria, hematuria, urinary incontinence, urinary frequency, nocturnal urination.  Endo: Negative for unusual weight change.    Physical Examination:   BP 111/72   Pulse 61   Temp 98 F (36.7 C) (Oral)   Ht '5\' 4"'$  (1.626 m)   Wt 97 lb (44 kg)   BMI 16.65 kg/m   General: Well-nourished, well-developed in no acute distress.  Eyes: No icterus. Conjunctivae pink. Lungs: Clear to auscultation bilaterally. Non-labored. Heart: Regular rate and rhythm, no murmurs rubs or gallops.  Abdomen: Bowel sounds are normal, nontender, nondistended, no hepatosplenomegaly or masses, no abdominal bruits or hernia , no rebound or guarding.   Extremities: No lower extremity edema. No clubbing or deformities. Neuro: Alert and oriented x 3.  Grossly intact. Skin: Warm and dry, no jaundice.   Psych: Alert and cooperative, normal mood and affect.  Labs:    Imaging Studies: No results found.  Assessment and Plan:   Karen Kelly is a 56 y.o. y/o female Who comes in today with a history of a imaging test that showed lesions on her liver.  The lesions were suggestive of a hemangioma.  The patient was a repeat visualization in 6 months.  The patient will be set up for an MRI of the liver to rule out a hemangioma.  The patient has The plan and agrees with it.     Lucilla Lame, MD. Marval Regal    Note: This dictation was prepared with Dragon dictation along with smaller phrase technology. Any transcriptional errors that result from this process are unintentional.

## 2022-03-20 NOTE — Patient Instructions (Addendum)
If you need to cancel or reschedule your MRI, please call scheduling directly at [phone]  MRI will be at [facility] entrance  October 17th arrival at 7:45am  You cannot have anything to eat or drink after 12 midnight the day before

## 2022-03-22 ENCOUNTER — Other Ambulatory Visit: Payer: Self-pay | Admitting: Gastroenterology

## 2022-03-22 DIAGNOSIS — Z9189 Other specified personal risk factors, not elsewhere classified: Secondary | ICD-10-CM

## 2022-03-26 ENCOUNTER — Other Ambulatory Visit: Payer: BC Managed Care – PPO

## 2022-04-22 ENCOUNTER — Ambulatory Visit
Admission: RE | Admit: 2022-04-22 | Discharge: 2022-04-22 | Disposition: A | Discharge status: Home / Self Care | Payer: BC Managed Care – PPO | Source: Ambulatory Visit | Attending: Gastroenterology | Admitting: Gastroenterology

## 2022-04-22 DIAGNOSIS — K769 Liver disease, unspecified: Secondary | ICD-10-CM | POA: Insufficient documentation

## 2022-04-22 MED ORDER — GADOBUTROL 1 MMOL/ML IV SOLN
5.0000 mL | Freq: Once | INTRAVENOUS | Status: AC | PRN
  Administered 2022-04-22: 5 mL via INTRAVENOUS

## 2022-04-29 ENCOUNTER — Encounter: Payer: Self-pay | Admitting: Gastroenterology

## 2022-07-29 ENCOUNTER — Encounter: Payer: Self-pay | Admitting: Family Medicine

## 2022-09-10 IMAGING — MR MR BREAST BILAT WO/W CM
8 of 12 series · 33 of 48 positions shown · IV contrast (5 ml gadavist)
Comparison: 02/29/2020 MR. Prior mammograms.

CLINICAL DATA: 55-year-old female with high lifetime risk for
developing breast cancer for screening breast

EXAM:
BILATERAL BREAST MRI WITH AND WITHOUT CONTRAST
TECHNIQUE: Multiplanar, multisequence MR images of both breasts were obtained
prior to and following the intravenous administration of 5 ml of
Gadavist

[Series 2: t2_tirm_tra ipat (a-p) · axial · 3.0mm · 0.66mm/px · 1 of 55 slices shown]
[im 1/55]
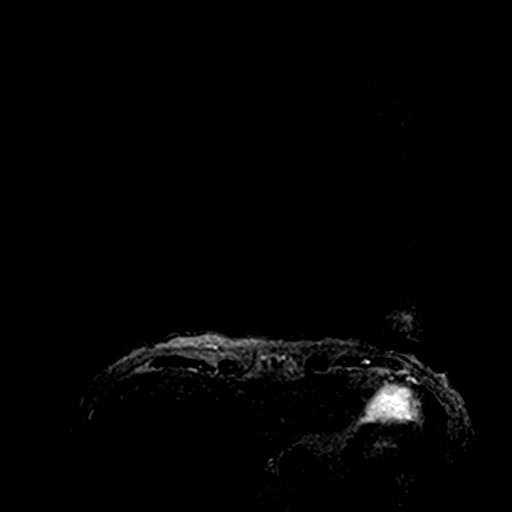

[Series 3: fl3d pre-cm no · axial · non-contrast · 1.2mm · 0.89mm/px · z∈[-103,+69]mm · 5 of 144 slices shown]
[im 1/144]
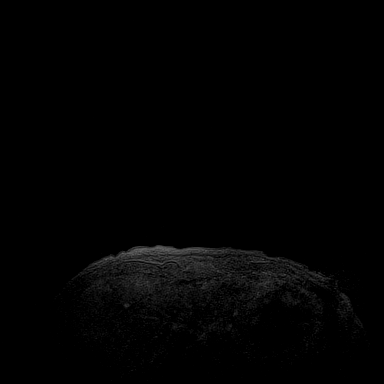
[im 36/144]
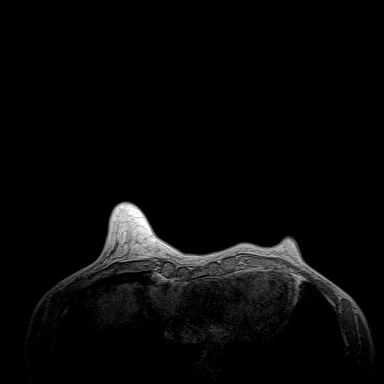
[im 72/144]
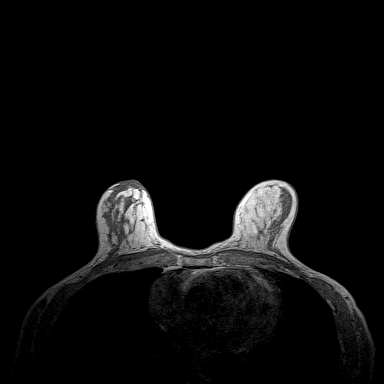
[im 108/144]
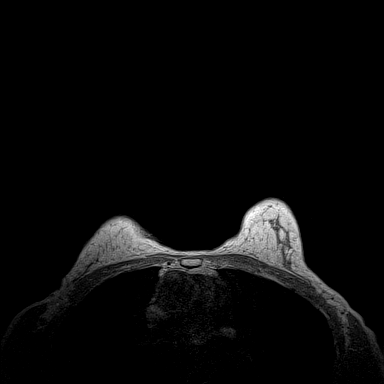
[im 144/144]
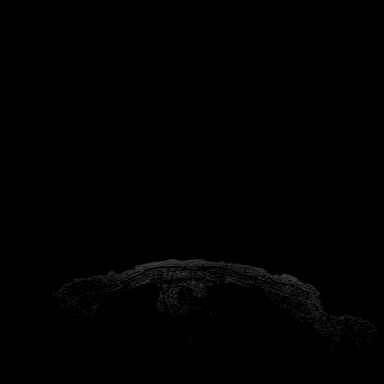

[Series 4: fl3d pre-cm · axial · non-contrast · 1.2mm · 0.89mm/px · z∈[-103,+69]mm · 5 of 144 slices shown]
[im 1/144]
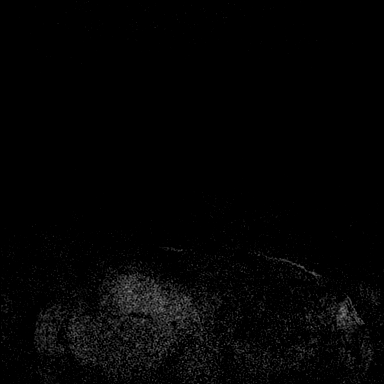
[im 36/144]
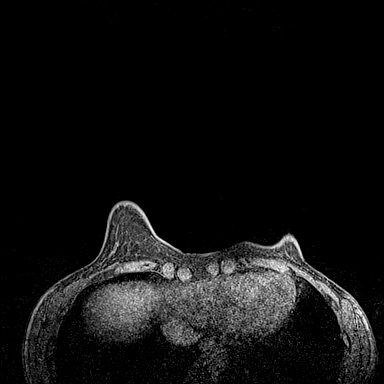
[im 72/144]
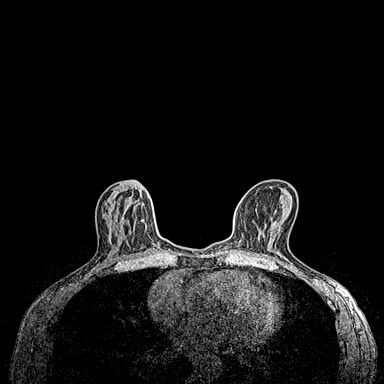
[im 108/144]
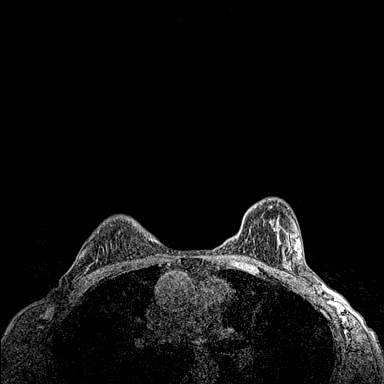
[im 144/144]
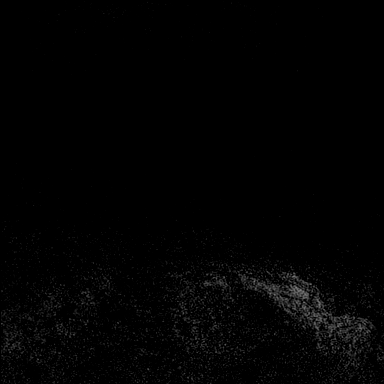

[Series 5: fl3d post-cm 20 · axial · 1.2mm · 0.89mm/px · z∈[-103,+69]mm · 5 of 144 slices shown (1 of 3)]
[im 1/144]
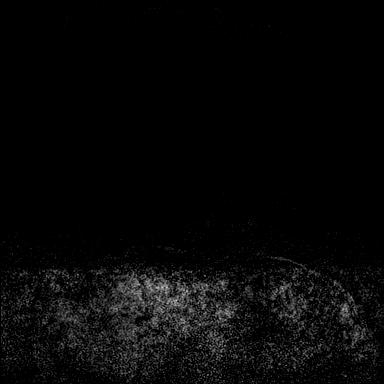
[im 36/144]
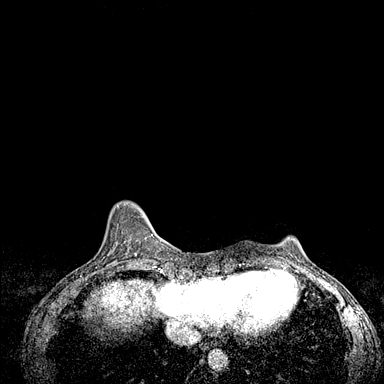
[im 72/144]
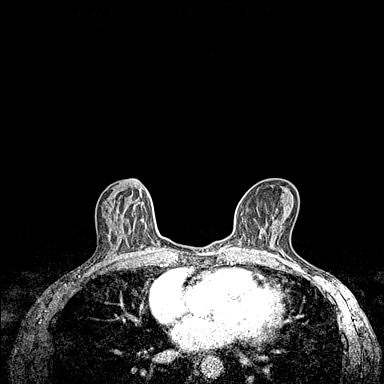
[im 108/144]
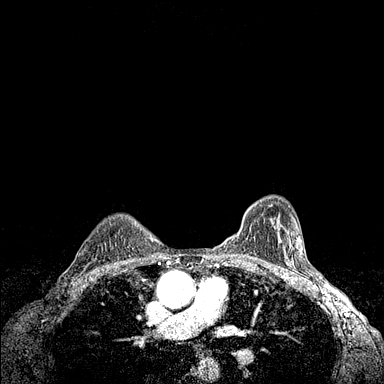
[im 144/144]
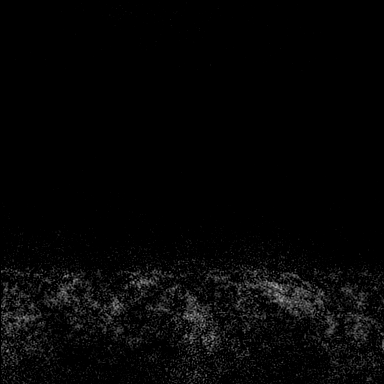

[Series 6: fl3d post-cm 20 · axial · 1.2mm · 0.89mm/px · z∈[-103,+69]mm · 5 of 144 slices shown (2 of 3)]
[im 1/144]
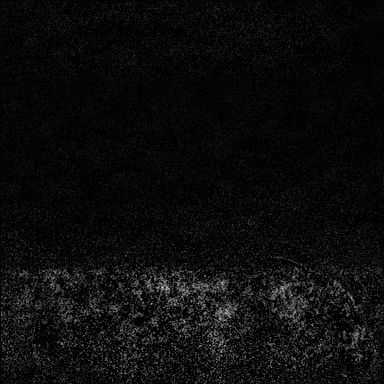
[im 36/144]
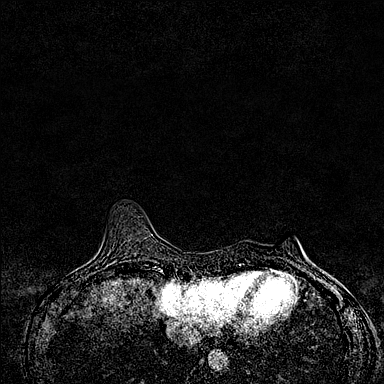
[im 72/144]
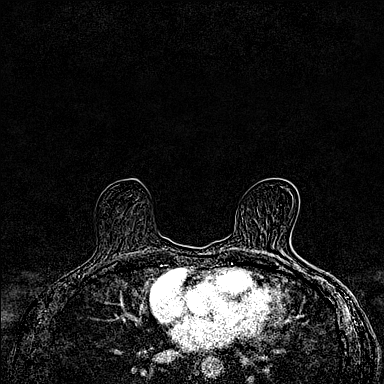
[im 108/144]
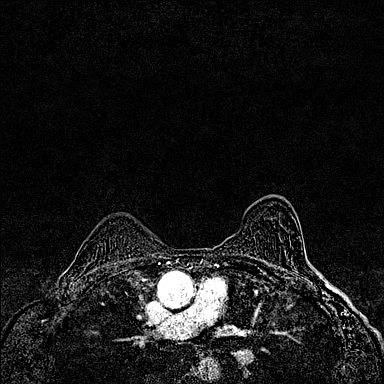
[im 144/144]
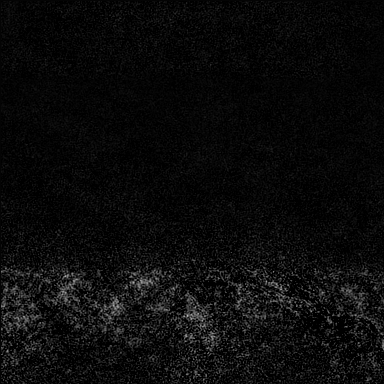

[Series 7: fl3d post-cm 20 · axial · 172.8mm · 0.89mm/px · 1 of 1 slices shown (3 of 3)]
[im 1/1]
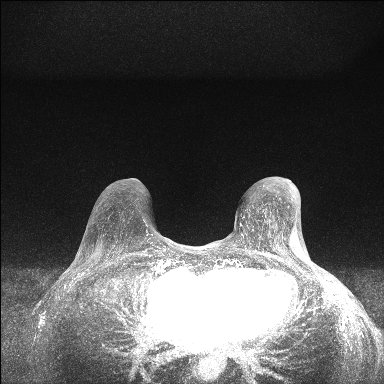

[Series 8: fl3d post-cm 3 · axial · 1.2mm · 0.89mm/px · z∈[-103,+69]mm · 6 of 144 slices shown (1 of 2)]
[im 1/144]
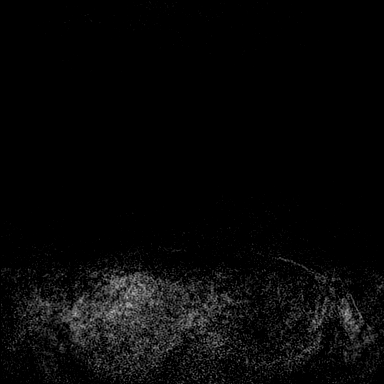
[im 29/144]
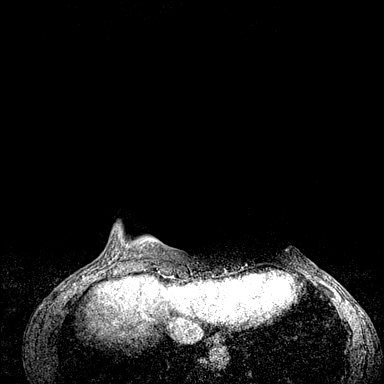
[im 58/144]
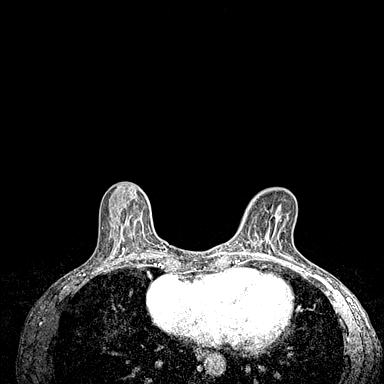
[im 86/144]
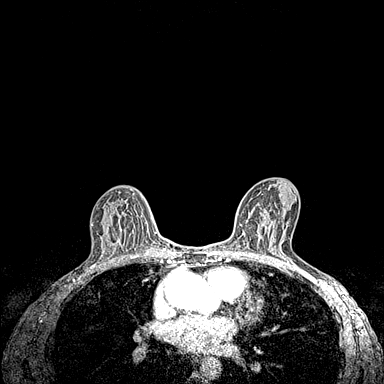
[im 115/144]
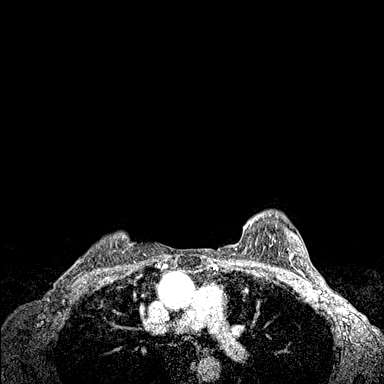
[im 144/144]
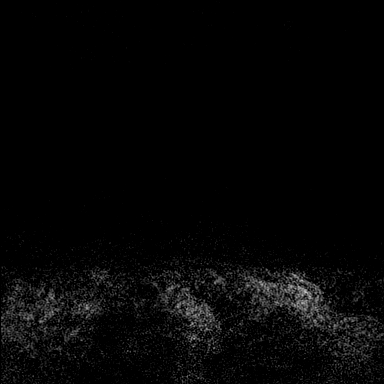

[Series 9: fl3d post-cm 3 · axial · 1.2mm · 0.89mm/px · z∈[-103,+34]mm · 5 of 144 slices shown (2 of 2)]
[im 1/144]
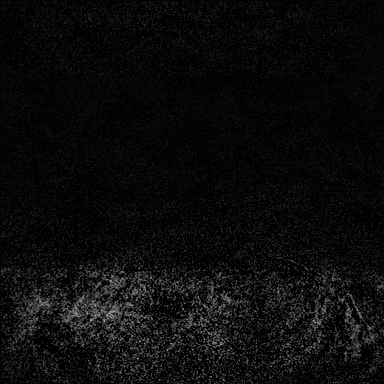
[im 29/144]
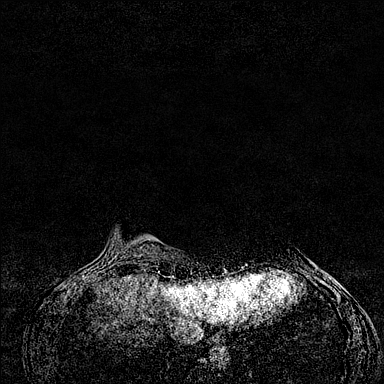
[im 58/144]
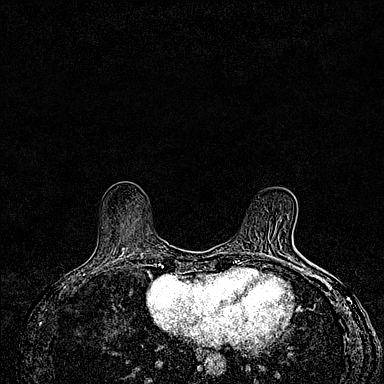
[im 86/144]
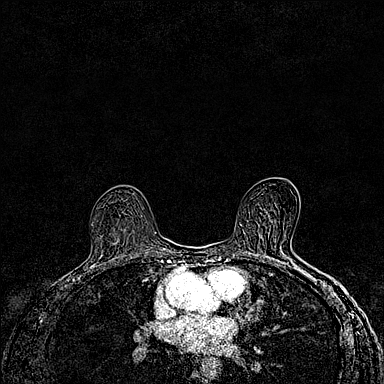
[im 115/144]
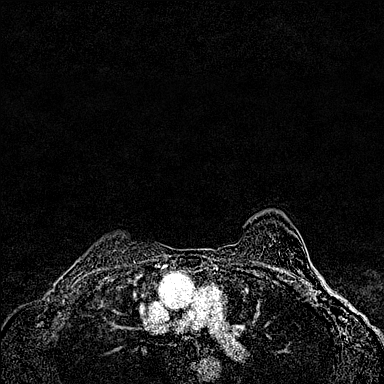

[33 of 48 positions shown; findings below may reference images not displayed]

Three-dimensional MR images were rendered by post-processing of the
original MR data on an independent workstation. The
three-dimensional MR images were interpreted, and findings are
reported in the following complete MRI report for this study. Three
dimensional images were evaluated at the independent interpreting
workstation using the DynaCAD thin client.
FINDINGS: Breast composition: c. Heterogeneous fibroglandular tissue.

Background parenchymal enhancement: Mild

Right breast: No mass or abnormal enhancement.

Left breast: No mass or abnormal enhancement.

Lymph nodes: No abnormal appearing lymph nodes.

Ancillary findings:  None.
IMPRESSION: 1. No MRI evidence of breast malignancy.

RECOMMENDATION:
Bilateral screening mammogram in 8 months to resume annual mammogram
schedule.

Bilateral screening breast MRI in 1 year if the patient's lifetime
risk for developing breast cancer is greater than 20%.

BI-RADS CATEGORY  1: Negative.
